# Patient Record
Sex: Male | Born: 1955 | Race: White | Hispanic: No | Marital: Married | Smoking: Former smoker
Health system: Southern US, Community
[De-identification: ages and names within clinical notes are randomized; demographics above are authoritative.]

## PROBLEM LIST (undated history)

## (undated) DIAGNOSIS — F32A Depression, unspecified: Secondary | ICD-10-CM

## (undated) DIAGNOSIS — M1990 Unspecified osteoarthritis, unspecified site: Secondary | ICD-10-CM

## (undated) DIAGNOSIS — E785 Hyperlipidemia, unspecified: Secondary | ICD-10-CM

## (undated) DIAGNOSIS — Z833 Family history of diabetes mellitus: Secondary | ICD-10-CM

## (undated) DIAGNOSIS — Z87442 Personal history of urinary calculi: Secondary | ICD-10-CM

## (undated) DIAGNOSIS — N200 Calculus of kidney: Secondary | ICD-10-CM

## (undated) DIAGNOSIS — F329 Major depressive disorder, single episode, unspecified: Secondary | ICD-10-CM

## (undated) HISTORY — PX: LITHOTRIPSY: SUR834

## (undated) HISTORY — PX: NO PAST SURGERIES: SHX2092

## (undated) HISTORY — DX: Calculus of kidney: N20.0

## (undated) HISTORY — DX: Hyperlipidemia, unspecified: E78.5

## (undated) HISTORY — DX: Family history of diabetes mellitus: Z83.3

---
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
## 2005-02-03 ENCOUNTER — Other Ambulatory Visit: Payer: Self-pay

## 2005-02-03 ENCOUNTER — Emergency Department: Payer: Self-pay | Admitting: Emergency Medicine

## 2005-02-06 ENCOUNTER — Inpatient Hospital Stay: Payer: Self-pay

## 2011-12-15 ENCOUNTER — Ambulatory Visit: Payer: Self-pay | Admitting: Family Medicine

## 2016-01-13 ENCOUNTER — Ambulatory Visit (INDEPENDENT_AMBULATORY_CARE_PROVIDER_SITE_OTHER): Payer: 59 | Admitting: Family Medicine

## 2016-01-13 ENCOUNTER — Encounter: Payer: Self-pay | Admitting: Family Medicine

## 2016-01-13 VITALS — BP 116/80 | HR 58 | Temp 98.0°F | Resp 16 | Ht 67.0 in | Wt 185.4 lb

## 2016-01-13 DIAGNOSIS — M7062 Trochanteric bursitis, left hip: Secondary | ICD-10-CM | POA: Diagnosis not present

## 2016-01-13 DIAGNOSIS — G8929 Other chronic pain: Secondary | ICD-10-CM | POA: Diagnosis not present

## 2016-01-13 DIAGNOSIS — M7061 Trochanteric bursitis, right hip: Secondary | ICD-10-CM | POA: Diagnosis not present

## 2016-01-13 DIAGNOSIS — M25512 Pain in left shoulder: Secondary | ICD-10-CM | POA: Diagnosis not present

## 2016-01-13 DIAGNOSIS — E785 Hyperlipidemia, unspecified: Secondary | ICD-10-CM | POA: Insufficient documentation

## 2016-01-13 DIAGNOSIS — Z87442 Personal history of urinary calculi: Secondary | ICD-10-CM | POA: Insufficient documentation

## 2016-01-13 DIAGNOSIS — L309 Dermatitis, unspecified: Secondary | ICD-10-CM | POA: Insufficient documentation

## 2016-01-13 MED ORDER — MELOXICAM 15 MG PO TABS: 15.0000 mg | ORAL_TABLET | Freq: Every day | ORAL | Status: DC

## 2016-01-13 NOTE — Patient Instructions (Signed)
Give the meloxicam 1-2 weeks to help. Let me know if you wish orthopedic referral for possible injection.
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
## 2016-02-04 ENCOUNTER — Encounter: Payer: Self-pay | Admitting: Physician Assistant

## 2016-02-04 ENCOUNTER — Ambulatory Visit (INDEPENDENT_AMBULATORY_CARE_PROVIDER_SITE_OTHER): Payer: 59 | Admitting: Physician Assistant

## 2016-02-04 VITALS — BP 140/80 | HR 72 | Temp 98.2°F | Resp 16 | Wt 187.6 lb

## 2016-02-04 DIAGNOSIS — H6983 Other specified disorders of Eustachian tube, bilateral: Secondary | ICD-10-CM | POA: Diagnosis not present

## 2016-02-04 DIAGNOSIS — H9193 Unspecified hearing loss, bilateral: Secondary | ICD-10-CM | POA: Diagnosis not present

## 2016-02-04 NOTE — Patient Instructions (Signed)

## 2016-02-04 NOTE — Progress Notes (Signed)
Patient: Brandon Galvan Male    DOB: 18-May-1956   60 y.o.   MRN: [id number] Visit Date: 02/04/2016  Today's Provider: Mar Daring, PA-C   Chief Complaint  Patient presents with  . Ear Fullness   Subjective:    Ear Fullness  There is pain in both ears. This is a new problem. The current episode started 1 to 4 weeks ago (For week). The problem occurs constantly. The problem has been gradually worsening. There has been no fever. The patient is experiencing no pain. 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 Pertinent negatives include no ear discharge or headaches. He has tried nothing for the symptoms.       Allergies  Allergen Reactions  . Amoxicillin Rash   Previous Medications   MELOXICAM (MOBIC) 15 MG TABLET    Take 1 tablet (15 mg total) by mouth daily.    Review of Systems  Constitutional: Positive for fatigue.  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 Negative for ear discharge and ear pain.   Respiratory: Negative.   Cardiovascular: Negative.   Gastrointestinal: Negative.   Neurological: Negative for dizziness and headaches.    Social History  Substance Use Topics  . Smoking status: Former Smoker    Types: Cigars  . Smokeless tobacco: Not on file  . Alcohol Use: Not on file   Objective:   BP 140/80 mmHg  Pulse 72  Temp(Src) 98.2 F (36.8 C) (Oral)  Resp 16  Wt 187 lb 9.6 oz (85.095 kg)  Physical Exam  Constitutional: He appears well-developed and well-nourished. No distress.  HENT:  Head: Normocephalic and atraumatic.  Right Ear: Hearing, tympanic membrane, external ear and ear canal normal. Tympanic membrane is not erythematous and not bulging.  No middle ear effusion.  Left Ear: Hearing, tympanic membrane, external ear and ear canal normal. Tympanic membrane is not erythematous and not bulging.  No middle ear effusion.  Nose: Nose normal. Right sinus exhibits no maxillary sinus tenderness and no frontal sinus tenderness. Left sinus exhibits no maxillary sinus tenderness and no frontal sinus tenderness.  Mouth/Throat: Uvula is midline, oropharynx is clear and moist and mucous membranes are normal. No oropharyngeal exudate, posterior oropharyngeal edema or posterior oropharyngeal erythema.  Eyes: Conjunctivae and EOM are normal. Pupils are equal, round, and reactive to light. Right eye exhibits no discharge. Left eye exhibits no discharge.  Neck: Normal range of motion. Neck supple. No tracheal deviation present. No Brudzinski's sign and no Kernig's sign noted. No thyromegaly present.  Cardiovascular: Normal rate, regular rhythm and normal heart sounds.  Exam reveals no gallop and no friction rub.   No murmur heard. Pulmonary/Chest: Effort normal and breath sounds normal. No stridor. No respiratory distress. He has no wheezes. He has no rales.  Lymphadenopathy:    He has no cervical adenopathy.  Skin: Skin is warm and dry. He is not diaphoretic.  Vitals reviewed.   Hearing Screening   125Hz  250Hz  500Hz  1000Hz  2000Hz  4000Hz  8000Hz   Right ear:   20 20 20 20    Left ear:   20 20 20  20  Assessment & Plan:     1. ETD (eustachian tube dysfunction), bilateral I do feel that his symptoms are most likely related to eustachian tube dysfunction being that he is having ear fullness and popping sensations as well. Hearing test today was normal in the office. I did advise him to start using Flonase 2 sprays each nostril daily to see if this helps his symptoms. He is to call the office if symptoms failed to improve or worsen.  2. Decreased hearing, bilateral Hearing screen was normal today. No cerumen buildup or impaction. - Hearing  screening; Future       Mar Daring, PA-C  [facility]

## 2016-02-09 ENCOUNTER — Other Ambulatory Visit: Payer: Self-pay | Admitting: Family Medicine

## 2016-03-16 ENCOUNTER — Other Ambulatory Visit: Payer: Self-pay | Admitting: Family Medicine

## 2016-09-08 ENCOUNTER — Other Ambulatory Visit: Payer: Self-pay | Admitting: Family Medicine

## 2016-10-15 ENCOUNTER — Other Ambulatory Visit: Payer: Self-pay | Admitting: Family Medicine

## 2016-12-08 ENCOUNTER — Encounter: Payer: Self-pay | Admitting: Family Medicine

## 2016-12-08 ENCOUNTER — Ambulatory Visit (INDEPENDENT_AMBULATORY_CARE_PROVIDER_SITE_OTHER): Payer: 59 | Admitting: Family Medicine

## 2016-12-08 VITALS — BP 132/94 | HR 64 | Temp 98.1°F | Resp 16 | Wt 181.2 lb

## 2016-12-08 DIAGNOSIS — R6882 Decreased libido: Secondary | ICD-10-CM | POA: Diagnosis not present

## 2016-12-08 DIAGNOSIS — R5383 Other fatigue: Secondary | ICD-10-CM | POA: Diagnosis not present

## 2016-12-08 DIAGNOSIS — Z13220 Encounter for screening for lipoid disorders: Secondary | ICD-10-CM

## 2016-12-08 NOTE — Patient Instructions (Signed)
Please get the labs fasting. We will call you about the results.

## 2016-12-08 NOTE — Progress Notes (Signed)
Subjective:     Patient ID: Brandon Galvan, male   DOB: 1956-04-03, 61 y.o.   MRN: [id number]  HPI  Chief Complaint  Patient presents with  . Labs Only    Patient comes in office today to discuss getting lab test to check his testosterone level. Patient reports "my strength and interest is not what it use to be".  Reports decreased libido and decreasing erections. States stamina for playing golf and work has decreased. He has obtained his black belt. 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 Denies depression or stress. Nocturia x one.   Review of Systems     Objective:   Physical Exam  Constitutional: He appears well-developed and well-nourished. No distress.  Psychiatric: He has a normal mood and affect. His behavior is normal.       Assessment:    1. Decreased libido - PSA - Testosterone  2. Decreased stamina - CBC with Differential/Platelet - Comprehensive metabolic panel - T4, free - TSH  3. Screening for cholesterol level - Lipid panel    Plan:    Further f/u pending lab work. He is aware that he will need a second AM testosterone level.

## 2016-12-16 ENCOUNTER — Telehealth: Payer: Self-pay

## 2016-12-16 LAB — CBC WITH DIFFERENTIAL/PLATELET
Basophils Absolute: 0 10*3/uL (ref 0.0–0.2)
Basos: 0 %
EOS (ABSOLUTE): 0.3 10*3/uL (ref 0.0–0.4)
EOS: 3 %
HEMATOCRIT: 45.2 % (ref 37.5–51.0)
HEMOGLOBIN: 15.7 g/dL (ref 13.0–17.7)
Immature Grans (Abs): 0 10*3/uL (ref 0.0–0.1)
Immature Granulocytes: 0 %
Lymphocytes Absolute: 1.1 10*3/uL (ref 0.7–3.1)
Lymphs: 12 %
MCH: 31.9 pg (ref 26.6–33.0)
MCHC: 34.7 g/dL (ref 31.5–35.7)
MCV: 92 fL (ref 79–97)
MONOCYTES: 13 %
Monocytes Absolute: 1.2 10*3/uL — ABNORMAL HIGH (ref 0.1–0.9)
NEUTROS ABS: 6.6 10*3/uL (ref 1.4–7.0)
Neutrophils: 72 %
Platelets: 206 10*3/uL (ref 150–379)
RBC: 4.92 x10E6/uL (ref 4.14–5.80)
RDW: 13.5 % (ref 12.3–15.4)
WBC: 9.3 10*3/uL (ref 3.4–10.8)

## 2016-12-16 LAB — COMPREHENSIVE METABOLIC PANEL
ALK PHOS: 73 IU/L (ref 39–117)
ALT: 20 IU/L (ref 0–44)
AST: 18 IU/L (ref 0–40)
Albumin/Globulin Ratio: 1.9 (ref 1.2–2.2)
Albumin: 4.3 g/dL (ref 3.6–4.8)
BILIRUBIN TOTAL: 0.6 mg/dL (ref 0.0–1.2)
BUN/Creatinine Ratio: 15 (ref 10–24)
BUN: 14 mg/dL (ref 8–27)
CHLORIDE: 98 mmol/L (ref 96–106)
CO2: 25 mmol/L (ref 18–29)
Calcium: 9.6 mg/dL (ref 8.6–10.2)
Creatinine, Ser: 0.94 mg/dL (ref 0.76–1.27)
GFR calc Af Amer: 101 mL/min/{1.73_m2} (ref >59)
GFR calc non Af Amer: 87 mL/min/{1.73_m2} (ref >59)
Globulin, Total: 2.3 g/dL (ref 1.5–4.5)
Glucose: 105 mg/dL — ABNORMAL HIGH (ref 65–99)
POTASSIUM: 4.3 mmol/L (ref 3.5–5.2)
SODIUM: 138 mmol/L (ref 134–144)
Total Protein: 6.6 g/dL (ref 6.0–8.5)

## 2016-12-16 LAB — TSH: TSH: 1.89 u[IU]/mL (ref 0.450–4.500)

## 2016-12-16 LAB — LIPID PANEL
CHOL/HDL RATIO: 4.6 ratio (ref 0.0–5.0)
Cholesterol, Total: 201 mg/dL — ABNORMAL HIGH (ref 100–199)
HDL: 44 mg/dL (ref >39)
LDL CALC: 136 mg/dL — AB (ref 0–99)
TRIGLYCERIDES: 107 mg/dL (ref 0–149)
VLDL Cholesterol Cal: 21 mg/dL (ref 5–40)

## 2016-12-16 LAB — T4, FREE: FREE T4: 1.19 ng/dL (ref 0.82–1.77)

## 2016-12-16 LAB — PSA: Prostate Specific Ag, Serum: 1.9 ng/mL (ref 0.0–4.0)

## 2016-12-16 LAB — TESTOSTERONE: Testosterone: 306 ng/dL (ref 264–916)

## 2016-12-16 NOTE — Telephone Encounter (Signed)
LMTCB 12/16/2016   Thanks,   -Mickel Baas

## 2016-12-16 NOTE — Telephone Encounter (Signed)
Pt advised.   Thanks,   -Laura  

## 2016-12-16 NOTE — Telephone Encounter (Signed)
-----   Message from Carmon Ginsberg, [place] sent at 12/16/2016  7:55 AM EDT ----- Mild cholesterol elevation but due to your age and mildly elevated blood pressure your calculated risk for developing cardiovascular disease is 10.8% over the next 10 years. We usually recommend a cholesterol lowering drug for risk > 7.5%. Would like to have you come in for a couple of nurse bp checks (won't cost you) over the next week or two. Let's see how your blood pressures does then we can discuss all of this at an office visit in a couple of weeks. Your testosterone level was normal.

## 2016-12-29 ENCOUNTER — Ambulatory Visit (INDEPENDENT_AMBULATORY_CARE_PROVIDER_SITE_OTHER): Payer: 59 | Admitting: Family Medicine

## 2016-12-29 ENCOUNTER — Encounter: Payer: Self-pay | Admitting: Family Medicine

## 2016-12-29 VITALS — BP 144/102 | HR 68 | Temp 97.9°F | Resp 16 | Wt 179.2 lb

## 2016-12-29 DIAGNOSIS — E782 Mixed hyperlipidemia: Secondary | ICD-10-CM

## 2016-12-29 DIAGNOSIS — R030 Elevated blood-pressure reading, without diagnosis of hypertension: Secondary | ICD-10-CM | POA: Diagnosis not present

## 2016-12-29 NOTE — Progress Notes (Signed)
Subjective:     Patient ID: Brandon Galvan, male   DOB: 1956/07/23, 61 y.o.   MRN: [id number]  HPI  Chief Complaint  Patient presents with  . Hypertension    Patient comes in office today to follow up for hypertension, patients blood pressure at last office visit was 132/94. Patient states that he has checked his blood pressure reading at home and it has been ranging around 110/54, patient states he has been under stress with work but is unsure if that is playinbg a factor in elevated blood pressure.   . Abnormal Lab    Patient would like to address Lipid panel drawn on 12/08/16. Patients lipid panel showed mild cholesterol elevation his 39yr risk for developing cardiovascular disease was at 10.8%. Patient states that he would like to discuss today lifestyle changes to reduce risk.   States he has started eating healthier stating he has quit [place]. Cholesterol was mildly elevated with age and mildly elevated blood pressure being the primary metrics elevating his risk. Continues to stay active with karate, golf, and an active job at [employer].   Review of Systems     Objective:   Physical Exam  Constitutional: He appears well-developed and well-nourished. No distress.       Assessment:    1. Mixed hyperlipidemia  2. Blood pressure elevated without history of HTN    Plan:    Provided with handouts regarding cholesterol and low cholesterol dietary choices. Discussed coming in with his home bp meter for comparison nurse bp checks.

## 2016-12-29 NOTE — Patient Instructions (Signed)
Come in for a nurse bp check to compare your machine with our. Continue wise food choices to lower cholesterol and continue regular exercise. Cholesterol Cholesterol is a fat. Your body needs a small amount of cholesterol. Cholesterol (plaque) may build up in your blood vessels (arteries). That makes you more likely to have a heart attack or stroke. You cannot feel your cholesterol level. Having a blood test is the only way to find out if your level is high. Keep your test results. Work with your doctor to keep your cholesterol at a good level. What do the results mean?  Total cholesterol is how much cholesterol is in your blood.  LDL is bad cholesterol. This is the type that can build up. Try to have low LDL.  HDL is good cholesterol. It cleans your blood vessels and carries LDL away. Try to have high HDL.  Triglycerides are fat that the body can store or burn for energy. What are good levels of cholesterol?  Total cholesterol below 200.  LDL below 100 is good for people who have health risks. LDL below 70 is good for people who have very high risks.  HDL above 40 is good. It is best to have HDL of 60 or higher.  Triglycerides below 150. How can I lower my cholesterol? Diet  Follow your diet program as told by your doctor.  Choose fish, white meat chicken, or [place] that is roasted or baked. Try not to eat red meat, fried foods, sausage, or lunch meats.  Eat lots of fresh fruits and vegetables.  Choose whole grains, beans, pasta, potatoes, and cereals.  Choose olive oil, corn oil, or canola oil. Only use small amounts.  Try not to eat butter, mayonnaise, shortening, or palm kernel oils.  Try not to eat foods with trans fats.  Choose low-fat or nonfat dairy foods.  Drink skim or nonfat milk.  Eat low-fat or nonfat yogurt and cheeses.  Try not to drink whole milk or cream.  Try not to eat ice cream, egg yolks, or full-fat cheeses.  Healthy desserts include angel food  cake, ginger snaps, animal crackers, hard candy, popsicles, and low-fat or nonfat frozen yogurt. Try not to eat pastries, cakes, pies, and cookies. Exercise  Follow your exercise program as told by your doctor.  Be more active. Try gardening, walking, and taking the stairs.  Ask your doctor about ways that you can be more active. Medicine  Take over-the-counter and prescription medicines only as told by your doctor. This information is not intended to replace advice given to you by your health care provider. Make sure you discuss any questions you have with your health care provider. Document Released: 12/02/2008 Document Revised: 04/06/2016 Document Reviewed: 03/17/2016 Elsevier Interactive Patient Education  2017 [employer].

## 2017-05-11 ENCOUNTER — Encounter: Payer: Self-pay | Admitting: Family Medicine

## 2017-05-11 ENCOUNTER — Ambulatory Visit (INDEPENDENT_AMBULATORY_CARE_PROVIDER_SITE_OTHER): Payer: 59 | Admitting: Family Medicine

## 2017-05-11 VITALS — BP 138/82 | HR 62 | Temp 97.8°F | Resp 16 | Ht 66.5 in | Wt 187.0 lb

## 2017-05-11 DIAGNOSIS — E782 Mixed hyperlipidemia: Secondary | ICD-10-CM

## 2017-05-11 DIAGNOSIS — N529 Male erectile dysfunction, unspecified: Secondary | ICD-10-CM | POA: Diagnosis not present

## 2017-05-11 DIAGNOSIS — Z1211 Encounter for screening for malignant neoplasm of colon: Secondary | ICD-10-CM

## 2017-05-11 DIAGNOSIS — Z23 Encounter for immunization: Secondary | ICD-10-CM | POA: Diagnosis not present

## 2017-05-11 MED ORDER — MELOXICAM 15 MG PO TABS: 15.0000 mg | ORAL_TABLET | ORAL | 0 refills | Status: DC | PRN

## 2017-05-11 MED ORDER — SILDENAFIL CITRATE 50 MG PO TABS: 50.0000 mg | ORAL_TABLET | Freq: Every day | ORAL | 5 refills | Status: DC | PRN

## 2017-05-11 NOTE — Progress Notes (Addendum)
Subjective:     Patient ID: Brandon Galvan, male   DOB: 1955/09/29, 61 y.o.   MRN: [id number]  HPI  Chief Complaint  Patient presents with  . Hyperlipidemia    Patient comes in today to follow up on his labs. He feels well today with no other complaints.   He has recently had labs drawn but forgot to bring them today. He has been working on food choices and remains active. He is willing to get a screening colonoscopy and update his tetanus shot today. Reports mild ED and wishes to try medication (Normal PSA in March)  Review of Systems  Respiratory: Negative for shortness of breath.   Cardiovascular: Negative for chest pain and palpitations.       Objective:   Physical Exam  Constitutional: He appears well-developed and well-nourished. No distress.       Assessment:    1. Mixed hyperlipidemia: pending review of lab results  2. Erectile dysfunction, unspecified erectile dysfunction type - sildenafil (VIAGRA) 50 MG tablet; Take 1 tablet (50 mg total) by mouth daily as needed for erectile dysfunction.  Dispense: 6 tablet; Refill: 5  3. Screening for colon cancer - Ambulatory referral to Gastroenterology  4. Need for tetanus, diphtheria, and acellular pertussis (Tdap) vaccine - Tdap vaccine greater than or equal to 7yo IM    Plan:    Further f/u pending review of lab results.

## 2017-05-11 NOTE — Patient Instructions (Addendum)
I will call you after reviewing your lab results. We will also call you about the referral. Let me know how Viagra works for you.

## 2017-05-19 ENCOUNTER — Telehealth: Payer: Self-pay

## 2017-05-19 ENCOUNTER — Other Ambulatory Visit: Payer: Self-pay

## 2017-05-19 DIAGNOSIS — Z1211 Encounter for screening for malignant neoplasm of colon: Secondary | ICD-10-CM

## 2017-05-19 MED ORDER — NA SULFATE-K SULFATE-MG SULF 17.5-3.13-1.6 GM/177ML PO SOLN: 1.0000 | Freq: Once | ORAL | 0 refills | Status: AC

## 2017-05-19 NOTE — Telephone Encounter (Signed)
Gastroenterology Pre-Procedure Review  Request Date: 9/27 Requesting Physician: Dr. Vicente Males  *No major illnesses at this time.  PATIENT REVIEW QUESTIONS: The patient responded to the following health history questions as indicated:    1. Are you having any GI issues? no 2. Do you have a personal history of Polyps? no 3. Do you have a family history of Colon Cancer or Polyps? Yes: father (polyps) 4. Diabetes Mellitus? No 5. Joint replacements in the past 12 months?no 6. Major health problems in the past 3 months?no 7. Any artificial heart valves, MVP, or defibrillator?no    MEDICATIONS & ALLERGIES:    Patient reports the following regarding taking any anticoagulation/antiplatelet therapy:   Plavix, Coumadin, Eliquis, Xarelto, Lovenox, Pradaxa, Brilinta, or Effient? no Aspirin? no  Patient confirms/reports the following medications:  Current Outpatient Prescriptions  Medication Sig Dispense Refill  . meloxicam (MOBIC) 15 MG tablet Take 1 tablet (15 mg total) by mouth as needed for pain. 30 tablet 0  . sildenafil (VIAGRA) 50 MG tablet Take 1 tablet (50 mg total) by mouth daily as needed for erectile dysfunction. 6 tablet 5   No current facility-administered medications for this visit.     Patient confirms/reports the following allergies:  Allergies  Allergen Reactions  . Amoxicillin Rash    No orders of the defined types were placed in this encounter.   AUTHORIZATION INFORMATION Primary Insurance: 1D#: Group #:  Secondary Insurance: 1D#: Group #:  SCHEDULE INFORMATION: Date: 9/27 Time: Location: [facility]

## 2017-05-19 NOTE — Telephone Encounter (Signed)
Patient requested that communication be sent via email, Patient not setup on mychart.  Sent paper mail instead.

## 2017-06-07 ENCOUNTER — Other Ambulatory Visit: Payer: Self-pay | Admitting: Family Medicine

## 2017-06-15 ENCOUNTER — Encounter
Admission: RE | Disposition: A | Discharge status: Home / Self Care | Payer: Self-pay | Source: Ambulatory Visit | Attending: Gastroenterology

## 2017-06-15 ENCOUNTER — Ambulatory Visit: Payer: 59 | Admitting: Anesthesiology

## 2017-06-15 ENCOUNTER — Ambulatory Visit
Admission: RE | Admit: 2017-06-15 | Discharge: 2017-06-15 | Disposition: A | Discharge status: Home / Self Care | Payer: 59 | Source: Ambulatory Visit | Attending: Gastroenterology | Admitting: Gastroenterology

## 2017-06-15 DIAGNOSIS — K5730 Diverticulosis of large intestine without perforation or abscess without bleeding: Secondary | ICD-10-CM | POA: Diagnosis not present

## 2017-06-15 DIAGNOSIS — Z8371 Family history of colonic polyps: Secondary | ICD-10-CM | POA: Diagnosis not present

## 2017-06-15 DIAGNOSIS — K640 First degree hemorrhoids: Secondary | ICD-10-CM | POA: Diagnosis not present

## 2017-06-15 DIAGNOSIS — D122 Benign neoplasm of ascending colon: Secondary | ICD-10-CM

## 2017-06-15 DIAGNOSIS — Z87891 Personal history of nicotine dependence: Secondary | ICD-10-CM | POA: Insufficient documentation

## 2017-06-15 DIAGNOSIS — Z1211 Encounter for screening for malignant neoplasm of colon: Secondary | ICD-10-CM | POA: Diagnosis present

## 2017-06-15 DIAGNOSIS — D120 Benign neoplasm of cecum: Secondary | ICD-10-CM | POA: Diagnosis not present

## 2017-06-15 DIAGNOSIS — K635 Polyp of colon: Secondary | ICD-10-CM | POA: Insufficient documentation

## 2017-06-15 DIAGNOSIS — D123 Benign neoplasm of transverse colon: Secondary | ICD-10-CM

## 2017-06-15 HISTORY — PX: COLONOSCOPY WITH PROPOFOL: SHX5780

## 2017-06-15 SURGERY — COLONOSCOPY WITH PROPOFOL
Anesthesia: General

## 2017-06-15 MED ORDER — PROPOFOL 10 MG/ML IV BOLUS
INTRAVENOUS | Status: DC | PRN
  Administered 2017-06-15: 100 mg via INTRAVENOUS

## 2017-06-15 MED ORDER — FENTANYL CITRATE (PF) 100 MCG/2ML IJ SOLN
INTRAMUSCULAR | Status: DC | PRN
  Administered 2017-06-15 (×2): 50 ug via INTRAVENOUS

## 2017-06-15 MED ORDER — LIDOCAINE HCL (PF) 2 % IJ SOLN
INTRAMUSCULAR | Status: AC
Start: 2017-06-15 — End: 2017-06-15
  Filled 2017-06-15: qty 2

## 2017-06-15 MED ORDER — FENTANYL CITRATE (PF) 100 MCG/2ML IJ SOLN
INTRAMUSCULAR | Status: AC
  Filled 2017-06-15: qty 2

## 2017-06-15 MED ORDER — EPHEDRINE SULFATE 50 MG/ML IJ SOLN
INTRAMUSCULAR | Status: DC | PRN
  Administered 2017-06-15: 10 mg via INTRAVENOUS

## 2017-06-15 MED ORDER — PHENYLEPHRINE HCL 10 MG/ML IJ SOLN
INTRAMUSCULAR | Status: DC | PRN
Start: 2017-06-15 — End: 2017-06-15
  Administered 2017-06-15: 100 ug via INTRAVENOUS

## 2017-06-15 MED ORDER — LIDOCAINE 2% (20 MG/ML) 5 ML SYRINGE
INTRAMUSCULAR | Status: DC | PRN
  Administered 2017-06-15: 40 mg via INTRAVENOUS

## 2017-06-15 MED ORDER — PROPOFOL 500 MG/50ML IV EMUL
INTRAVENOUS | Status: DC | PRN
  Administered 2017-06-15: 180 ug/kg/min via INTRAVENOUS

## 2017-06-15 MED ORDER — SODIUM CHLORIDE 0.9 % IV SOLN
INTRAVENOUS | Status: DC
  Administered 2017-06-15 (×2): via INTRAVENOUS

## 2017-06-15 MED ORDER — PROPOFOL 500 MG/50ML IV EMUL
INTRAVENOUS | Status: AC
  Filled 2017-06-15: qty 50

## 2017-06-15 NOTE — H&P (Signed)
  Jonathon Bellows MD [address] [place], [place] [address] Phone: [phone] Fax : [phone]  Primary Care Physician:  Carmon Ginsberg, [place] Primary Gastroenterologist:  Dr. Jonathon Bellows   Pre-Procedure History & Physical: HPI:  Brandon Galvan is a 61 y.o. male is here for an colonoscopy.   Past Medical History:  Diagnosis Date  . Family history of diabetes mellitus   . Hyperlipidemia     Past Surgical History:  Procedure Laterality Date  . NO PAST SURGERIES      Prior to Admission medications   Medication Sig Start Date End Date Taking? Authorizing Provider  meloxicam (MOBIC) 15 MG tablet TAKE 1 TABLET BY MOUTH DAILY AS NEEDED FOR PAIN 06/07/17  Yes Carmon Ginsberg, PA  sildenafil (VIAGRA) 50 MG tablet Take 1 tablet (50 mg total) by mouth daily as needed for erectile dysfunction. 05/11/17   Carmon Ginsberg, PA    Allergies as of 05/19/2017 - Review Complete 05/11/2017  Allergen Reaction Noted  . Amoxicillin Rash 01/13/2016    Family History  Problem Relation Age of Onset  . Osteoporosis Mother   . Arthritis Mother   . Dementia Mother   . Hypertension Father   . Diabetes Father   . Diabetes Sister   . Hypertension Sister   . Healthy Daughter   . Healthy Son   . Healthy Daughter   . Healthy Daughter     Social History   Social History  . Marital status: Married    Spouse name: N/A  . Number of children: N/A  . Years of education: N/A   Occupational History  . Not on file.   Social History Main Topics  . Smoking status: Former Smoker    Types: Cigars  . Smokeless tobacco: Never Used  . Alcohol use Not on file  . Drug use: Unknown  . Sexual activity: Not on file   Other Topics Concern  . Not on file   Social History Narrative  . No narrative on file    Review of Systems: See HPI, otherwise negative ROS  Physical Exam: BP (!) 147/83   Pulse 75   Temp 98.9 F (37.2 C) (Tympanic)   Resp 18   Ht 5\' 7"  (1.702 m)   Wt 178 lb (80.7  kg)   SpO2 99%   BMI 27.88 kg/m  General:   Alert,  pleasant and cooperative in NAD Head:  Normocephalic and atraumatic. Neck:  Supple; no masses or thyromegaly. Lungs:  Clear throughout to auscultation.    Heart:  Regular rate and rhythm. Abdomen:  Soft, nontender and nondistended. Normal bowel sounds, without guarding, and without rebound.   Neurologic:  Alert and  oriented x4;  grossly normal neurologically.  Impression/Plan: Brandon Galvan is here for an colonoscopy to be performed for screening colonoscopy ( father had colon polyps)  Risks, benefits, limitations, and alternatives regarding  colonoscopy have been reviewed with the patient.  Questions have been answered.  All parties agreeable.   Jonathon Bellows, MD  06/15/2017, 10:16 AM

## 2017-06-15 NOTE — Anesthesia Post-op Follow-up Note (Signed)
Anesthesia QCDR form completed.        

## 2017-06-15 NOTE — Op Note (Signed)
[facility] Patient Name: Brandon Galvan Procedure Date: 06/15/2017 10:55 AM MRN: [id number] Account #: [id number] Date of Birth: 04-28-1956 Admit Type: Outpatient Age: 61 Room: [facility] ENDO ROOM 4 Gender: Male Note Status: Finalized Procedure:            Colonoscopy Indications:          Screening for colorectal malignant neoplasm Providers:            Jonathon Bellows MD, MD Referring MD:         Rose Fillers. Jaynie Crumble, MD (Referring MD) Medicines:            Monitored Anesthesia Care Complications:        No immediate complications. Procedure:            Pre-Anesthesia Assessment:                       - Prior to the procedure, a History and Physical was                        performed, and patient medications, allergies and                        sensitivities were reviewed. The patient's tolerance of                        previous anesthesia was reviewed.                       - The risks and benefits of the procedure and the                        sedation options and risks were discussed with the                        patient. All questions were answered and informed                        consent was obtained.                       - ASA Grade Assessment: II - A patient with mild                        systemic disease.                       After obtaining informed consent, the colonoscope was                        passed under direct vision. Throughout the procedure,                        the patient's blood pressure, pulse, and oxygen                        saturations were monitored continuously. The                        Colonoscope was introduced through the anus and  advanced to the the cecum, identified by the                        appendiceal orifice, IC valve and transillumination.                        The colonoscopy was performed with ease. The patient                        tolerated the procedure well. The  quality of the bowel                        preparation was good. Findings:      The perianal and digital rectal examinations were normal.      Multiple medium-mouthed diverticula were found in the sigmoid colon.      Non-bleeding internal hemorrhoids were found during retroflexion. The       hemorrhoids were small and Grade I (internal hemorrhoids that do not       prolapse).      Two sessile polyps were found in the cecum. The polyps were 2 to 4 mm in       size. These polyps were removed with a cold biopsy forceps. Resection       and retrieval were complete.      Two sessile polyps were found in the transverse colon and ascending       colon. The polyps were 3 to 4 mm in size. These polyps were removed with       a cold biopsy forceps. Resection and retrieval were complete.      A 6 mm polyp was found in the transverse colon. The polyp was sessile.       The polyp was removed with a cold snare. Resection and retrieval were       complete.      The exam was otherwise without abnormality on direct and retroflexion       views. Impression:           - Diverticulosis in the sigmoid colon.                       - Non-bleeding internal hemorrhoids.                       - Two 2 to 4 mm polyps in the cecum, removed with a                        cold biopsy forceps. Resected and retrieved.                       - Two 3 to 4 mm polyps in the transverse colon and in                        the ascending colon, removed with a cold biopsy                        forceps. Resected and retrieved.                       - One 6 mm polyp in the transverse colon, removed with  a cold snare. Resected and retrieved.                       - The examination was otherwise normal on direct and                        retroflexion views. Recommendation:       - Discharge patient to home (with escort).                       - Resume previous diet.                       - Continue present  medications.                       - Await pathology results.                       - Repeat colonoscopy for surveillance based on                        pathology results. Procedure Code(s):    --- Professional ---                       [id number], Colonoscopy, flexible; with removal of tumor(s),                        polyp(s), or other lesion(s) by snare technique                       45380, 37, Colonoscopy, flexible; with biopsy, single                        or multiple Diagnosis Code(s):    --- Professional ---                       Z12.11, Encounter for screening for malignant neoplasm                        of colon                       K64.0, First degree hemorrhoids                       D12.0, Benign neoplasm of cecum                       D12.3, Benign neoplasm of transverse colon (hepatic                        flexure or splenic flexure)                       D12.2, Benign neoplasm of ascending colon                       K57.30, Diverticulosis of large intestine without                        perforation or abscess without bleeding CPT copyright 2016 American Medical Association. All rights reserved. The codes documented in this report  are preliminary and upon coder review may  be revised to meet current compliance requirements. Jonathon Bellows, MD Jonathon Bellows MD, MD 06/15/2017 11:20:50 AM This report has been signed electronically. Number of Addenda: 0 Note Initiated On: 06/15/2017 10:55 AM Scope Withdrawal Time: 0 hours 16 minutes 1 second  Total Procedure Duration: 0 hours 18 minutes 23 seconds       [facility]

## 2017-06-15 NOTE — Transfer of Care (Signed)
Immediate Anesthesia Transfer of Care Note  Patient: Brandon Galvan  Procedure(s) Performed: Procedure(s): COLONOSCOPY WITH PROPOFOL (N/A)  Patient Location: PACU and Endoscopy Unit  Anesthesia Type:General  Level of Consciousness: drowsy  Airway & Oxygen Therapy: Patient Spontanous Breathing and Patient connected to nasal cannula oxygen  Post-op Assessment: Report given to RN and Post -op Vital signs reviewed and stable  Post vital signs: Reviewed and stable  Last Vitals:  Vitals:   06/15/17 0922  BP: (!) 147/83  Pulse: 75  Resp: 18  Temp: 37.2 C  SpO2: 99%    Last Pain:  Vitals:   06/15/17 0922  TempSrc: Tympanic         Complications: No apparent anesthesia complications

## 2017-06-15 NOTE — Anesthesia Preprocedure Evaluation (Signed)
Anesthesia Evaluation  Patient identified by MRN, date of birth, ID band Patient awake    Reviewed: Allergy & Precautions, H&P , NPO status , Patient's Chart, lab work & pertinent test results, reviewed documented beta blocker date and time   Airway Mallampati: II   Neck ROM: full    Dental  (+) Teeth Intact   Pulmonary neg pulmonary ROS, former smoker,    Pulmonary exam normal        Cardiovascular Exercise Tolerance: Good negative cardio ROS Normal cardiovascular exam Rhythm:regular Rate:Normal     Neuro/Psych negative neurological ROS  negative psych ROS   GI/Hepatic negative GI ROS, Neg liver ROS,   Endo/Other  negative endocrine ROS  Renal/GU negative Renal ROS  negative genitourinary   Musculoskeletal   Abdominal   Peds  Hematology negative hematology ROS (+)   Anesthesia Other Findings Past Medical History: No date: Family history of diabetes mellitus No date: Hyperlipidemia Past Surgical History: No date: NO PAST SURGERIES BMI    Body Mass Index:  27.88 kg/m     Reproductive/Obstetrics negative OB ROS                             Anesthesia Physical Anesthesia Plan  ASA: II  Anesthesia Plan: General   Post-op Pain Management:    Induction:   PONV Risk Score and Plan:   Airway Management Planned:   Additional Equipment:   Intra-op Plan:   Post-operative Plan:   Informed Consent: I have reviewed the patients History and Physical, chart, labs and discussed the procedure including the risks, benefits and alternatives for the proposed anesthesia with the patient or authorized representative who has indicated his/her understanding and acceptance.   Dental Advisory Given  Plan Discussed with: CRNA  Anesthesia Plan Comments:         Anesthesia Quick Evaluation

## 2017-06-16 ENCOUNTER — Encounter: Payer: Self-pay | Admitting: Gastroenterology

## 2017-06-16 LAB — SURGICAL PATHOLOGY

## 2017-06-18 ENCOUNTER — Encounter: Payer: Self-pay | Admitting: Gastroenterology

## 2017-06-19 NOTE — Anesthesia Postprocedure Evaluation (Signed)
Anesthesia Post Note  Patient: Brandon Galvan  Procedure(s) Performed: COLONOSCOPY WITH PROPOFOL (N/A )  Patient location during evaluation: PACU Anesthesia Type: General Level of consciousness: awake and alert Pain management: pain level controlled Vital Signs Assessment: post-procedure vital signs reviewed and stable Respiratory status: spontaneous breathing, nonlabored ventilation, respiratory function stable and patient connected to nasal cannula oxygen Cardiovascular status: blood pressure returned to baseline and stable Postop Assessment: no apparent nausea or vomiting Anesthetic complications: no     Last Vitals:  Vitals:   06/15/17 1143 06/15/17 1203  BP: 107/67 111/72  Pulse: 72   Resp: 13   Temp:    SpO2: 98%     Last Pain:  Vitals:   06/15/17 1123  TempSrc: Tympanic                 Molli Barrows

## 2017-07-14 ENCOUNTER — Telehealth: Payer: Self-pay | Admitting: Family Medicine

## 2017-07-14 NOTE — Telephone Encounter (Signed)
Pt scheduled/MW

## 2017-07-14 NOTE — Telephone Encounter (Signed)
Sure if we have a supply and he has not had another shot esp.the flu shot in the last 4 weeks.

## 2017-07-14 NOTE — Telephone Encounter (Signed)
lmtcb-Brandon Galvan Brandon Galvan Brandon Galvan, RMA  

## 2017-07-14 NOTE — Telephone Encounter (Signed)
Please review-Brandon Galvan, RMA  

## 2017-07-14 NOTE — Telephone Encounter (Signed)
Pt is requesting the shingles shot.   Please let me know if pt can be scheduled.   CB#[phone]/MW

## 2017-07-17 ENCOUNTER — Ambulatory Visit: Payer: 59 | Admitting: Family Medicine

## 2017-07-19 ENCOUNTER — Ambulatory Visit: Payer: 59 | Admitting: Family Medicine

## 2017-08-25 ENCOUNTER — Encounter: Payer: Self-pay | Admitting: Family Medicine

## 2017-08-25 ENCOUNTER — Ambulatory Visit: Payer: 59 | Admitting: Family Medicine

## 2017-08-25 VITALS — BP 110/88 | HR 61 | Temp 97.8°F | Resp 16 | Wt 192.0 lb

## 2017-08-25 DIAGNOSIS — H00012 Hordeolum externum right lower eyelid: Secondary | ICD-10-CM

## 2017-08-25 MED ORDER — ERYTHROMYCIN 5 MG/GM OP OINT: TOPICAL_OINTMENT | OPHTHALMIC | 0 refills | Status: DC

## 2017-08-25 NOTE — Progress Notes (Signed)
Subjective:     Patient ID: NYSIR FERGUSSON, male   DOB: Feb 04, 1956, 61 y.o.   MRN: [id number] Chief Complaint  Patient presents with  . Stye    Patient comes in office today with conerns of a stye on his lower right lid that has been present for the past 4-5 days, patient states that eye is tender to the touch.    HPI States he has used warm compresses on a couple of occasions.  Review of Systems     Objective:   Physical Exam  Constitutional: He appears well-developed and well-nourished. No distress.  Eyes:  Right lower eyelid with sty with both internal and external swelling. External aspect is pointing. Cleansed with alcohol and area of pointing nicked with bayonet point blade with return of pus.       Assessment:    1. Hordeolum externum of right lower eyelid: erythromycin ophthalmic ointment 3 x day to right lower eyelid sac.    Plan:    Continue warm compresses.

## 2017-08-25 NOTE — Patient Instructions (Signed)
Discussed use of warm compresses several x day to right lower eyelid.

## 2017-12-22 ENCOUNTER — Other Ambulatory Visit: Payer: Self-pay | Admitting: Family Medicine

## 2018-01-11 ENCOUNTER — Encounter: Payer: Self-pay | Admitting: Family Medicine

## 2018-01-11 ENCOUNTER — Ambulatory Visit (INDEPENDENT_AMBULATORY_CARE_PROVIDER_SITE_OTHER): Payer: Managed Care, Other (non HMO) | Admitting: Family Medicine

## 2018-01-11 VITALS — BP 130/88 | HR 72 | Temp 98.2°F | Resp 16

## 2018-01-11 DIAGNOSIS — H43391 Other vitreous opacities, right eye: Secondary | ICD-10-CM

## 2018-01-11 DIAGNOSIS — M545 Low back pain, unspecified: Secondary | ICD-10-CM

## 2018-01-11 DIAGNOSIS — H8113 Benign paroxysmal vertigo, bilateral: Secondary | ICD-10-CM | POA: Diagnosis not present

## 2018-01-11 LAB — POCT URINALYSIS DIPSTICK
Appearance: NORMAL
Bilirubin, UA: NEGATIVE
Blood, UA: NEGATIVE
Glucose, UA: NEGATIVE
KETONES UA: NEGATIVE
Leukocytes, UA: NEGATIVE
NITRITE UA: NEGATIVE
ODOR: NORMAL
PH UA: 6 (ref 5.0–8.0)
SPEC GRAV UA: 1.02 (ref 1.010–1.025)
UROBILINOGEN UA: 0.2 U/dL

## 2018-01-11 MED ORDER — MECLIZINE HCL 25 MG PO TABS: 25.0000 mg | ORAL_TABLET | Freq: Three times a day (TID) | ORAL | 1 refills | Status: DC | PRN

## 2018-01-11 NOTE — Patient Instructions (Signed)

## 2018-01-11 NOTE — Progress Notes (Signed)
Patient: Brandon Galvan Male    DOB: March 04, 1956   62 y.o.   MRN: [id number] Visit Date: 01/11/2018  Today's Provider: Lavon Paganini, MD   I, Martha Clan, CMA, am acting as scribe for Lavon Paganini, MD.  Chief Complaint  Patient presents with  . Dizziness   Subjective:    Dizziness  This is a new problem. The current episode started today. The problem occurs constantly. The problem has been unchanged. Associated symptoms include myalgias (low back pain) and a visual change (yellow field in vision). Pertinent negatives include no abdominal pain, anorexia, arthralgias, change in bowel habit, chest pain, chills, congestion, coughing, diaphoresis, fatigue, fever, headaches, nausea, neck pain, numbness, sore throat, swollen glands, urinary symptoms (states he has low back pain), vomiting or weakness. Exacerbated by: positional.       Allergies  Allergen Reactions  . Amoxicillin Rash     Current Outpatient Medications:  .  meloxicam (MOBIC) 15 MG tablet, TAKE 1 TABLET BY MOUTH DAILY AS NEEDED FOR PAIN, Disp: 30 tablet, Rfl: 0 .  sildenafil (VIAGRA) 50 MG tablet, Take 1 tablet (50 mg total) by mouth daily as needed for erectile dysfunction., Disp: 6 tablet, Rfl: 5  Review of Systems  Constitutional: Negative for chills, diaphoresis, fatigue and fever.  HENT: Negative for congestion and sore throat.   Respiratory: Negative for cough.   Cardiovascular: Negative for chest pain.  Gastrointestinal: Negative for abdominal pain, anorexia, change in bowel habit, nausea and vomiting.  Musculoskeletal: Positive for myalgias (low back pain). Negative for arthralgias and neck pain.  Neurological: Positive for dizziness. Negative for weakness, numbness and headaches.    Social History   Tobacco Use  . Smoking status: Former Smoker    Types: Cigars  . Smokeless tobacco: Never Used  Substance Use Topics  . Alcohol use: Not on file   Objective:   BP 130/88 (BP  Location: Left Arm, Patient Position: Sitting, Cuff Size: Large)   Pulse 72   Temp 98.2 F (36.8 C) (Oral)   Resp 16   SpO2 98%  Vitals:   01/11/18 1324  BP: 130/88  Pulse: 72  Resp: 16  Temp: 98.2 F (36.8 C)  TempSrc: Oral  SpO2: 98%     Physical Exam  Constitutional: He is oriented to person, place, and time. He appears well-developed and well-nourished. No distress.  HENT:  Head: Normocephalic and atraumatic.  Right Ear: External ear normal.  Left Ear: External ear normal.  Nose: Nose normal. Right sinus exhibits no maxillary sinus tenderness and no frontal sinus tenderness. Left sinus exhibits no maxillary sinus tenderness and no frontal sinus tenderness.  Mouth/Throat: Uvula is midline, oropharynx is clear and moist and mucous membranes are normal.  + Epleys  Eyes: Pupils are equal, round, and reactive to light. Conjunctivae and EOM are normal. Right eye exhibits no discharge. Left eye exhibits no discharge. No scleral icterus.  Neck: Neck supple. No thyromegaly present.  Cardiovascular: Normal rate, regular rhythm, normal heart sounds and intact distal pulses.  No murmur heard. Pulmonary/Chest: Effort normal and breath sounds normal. No respiratory distress. He has no wheezes. He has no rales.  Musculoskeletal: He exhibits no edema.  Back: No midline TTP or paraspinal TTP.  LE strength and sensation intact.  Lymphadenopathy:    He has no cervical adenopathy.  Neurological: He is alert and oriented to person, place, and time.  Skin: Skin is warm and dry. Capillary refill takes less than 2 seconds.  No rash noted.  Psychiatric: He has a normal mood and affect. His behavior is normal.  Vitals reviewed.      Assessment & Plan:   1. Benign paroxysmal positional vertigo due to bilateral vestibular disorder - HPI and + Epleys consistent with BPPV - no signs of stroke and neuro intact - will treat with meclizine TID prn - symptomatic management, natural course, and  return precautions - could consider vestibular PT if continues  2. Acute midline low back pain without sciatica - likely MSK/muscle strain  - reassured patient that there is no evidence of nephrolithiasis - POCT urinalysis dipstick  3. Vitreous floaters of right eye - advised annual eye exam and to mention to his Ophthalmologist  - this is common in his age group and unlikely to be related to his BPPV.   Meds ordered this encounter  Medications  . meclizine (ANTIVERT) 25 MG tablet    Sig: Take 1 tablet (25 mg total) by mouth 3 (three) times daily as needed for dizziness.    Dispense:  90 tablet    Refill:  1     Return if symptoms worsen or fail to improve.   The entirety of the information documented in the History of Present Illness, Review of Systems and Physical Exam were personally obtained by me. Portions of this information were initially documented by Raquel Sarna Ratchford, CMA and reviewed by me for thoroughness and accuracy.    Virginia Crews, MD, MPH [facility] 01/11/2018 3:18 PM

## 2018-02-06 ENCOUNTER — Other Ambulatory Visit: Payer: Self-pay | Admitting: Family Medicine

## 2018-03-25 ENCOUNTER — Other Ambulatory Visit: Payer: Self-pay | Admitting: Family Medicine

## 2018-07-09 ENCOUNTER — Encounter: Payer: Self-pay | Admitting: Family Medicine

## 2018-07-09 ENCOUNTER — Ambulatory Visit: Payer: Managed Care, Other (non HMO) | Admitting: Family Medicine

## 2018-07-09 VITALS — BP 130/84 | HR 76 | Temp 97.6°F | Resp 16 | Wt 170.8 lb

## 2018-07-09 DIAGNOSIS — J3489 Other specified disorders of nose and nasal sinuses: Secondary | ICD-10-CM

## 2018-07-09 NOTE — Patient Instructions (Signed)
Use saline irrigation 1-2 x day then apply triple antibiotic ointment with a Q-tip afterwards.

## 2018-07-09 NOTE — Progress Notes (Signed)
  Subjective:     Patient ID: Brandon Galvan, male   DOB: 1955/11/27, 62 y.o.   MRN: [id number] Chief Complaint  Patient presents with  . Skin Problem    Patient comes in office today with concerns or a possible sore/bump on the right side of his nose for over 7 days. Patient states in the past week it has become more sore.    HPI States it is sore inside his nose Denies injury, nosebleeds, or use of nasal sprays.No MRSA exposure reported. States he has sneezed a few times.   Review of Systems     Objective:   Physical Exam  Constitutional: He appears well-developed and well-nourished. No distress.  HENT:  Area of irritation/erosion noted lateral third of right nares. No polyp or bleeding noted.       Assessment:    1. Nose irritation: Try saline rinse and abx ointment administered with a Q-tip.    Plan:    Samples of saline rinse and abx ointment provided.

## 2018-07-17 ENCOUNTER — Encounter: Payer: Self-pay | Admitting: Family Medicine

## 2018-07-17 ENCOUNTER — Ambulatory Visit: Payer: Managed Care, Other (non HMO) | Admitting: Family Medicine

## 2018-07-17 VITALS — BP 138/80 | HR 63 | Temp 97.8°F | Wt 171.8 lb

## 2018-07-17 DIAGNOSIS — R21 Rash and other nonspecific skin eruption: Secondary | ICD-10-CM

## 2018-07-17 NOTE — Progress Notes (Signed)
  Subjective:     Patient ID: Brandon Galvan, male   DOB: 17-Nov-1955, 62 y.o.   MRN: [id number] Chief Complaint  Patient presents with  . Rash    Patient presents today with a rash on his chest, arm, side, legs, and hands. Patient states that the rash has been there since over a week. Patient states the rash is red and flakey and he has not applied nothing to the rashes.   HPI States rash is asymptomatic but started on his legs months ago. Now on his upper chest and forearms. Uses an organic soap in the bath. Has an indoor cat at home. Works at [employer].  Review of Systems     Objective:   Physical Exam  Constitutional: He appears well-developed and well-nourished. No distress.  Skin:  Forearms with scaly rash in both a nummular and irregular pattern. Lower legs and upper chest with a mildly scaly papular rash        Assessment:    1. Rash: ? Due to dry skin - Ambulatory referral to Dermatology    Plan:   Discussed use of Dove soap and moisturizing creams.

## 2018-07-17 NOTE — Patient Instructions (Addendum)
Switch to [employer] with moisturizer. Try Cetaphil or Eucerin hypoallergenic moisturizing cream. We will call about the dermatology referral.

## 2018-07-30 ENCOUNTER — Other Ambulatory Visit: Payer: Self-pay | Admitting: Family Medicine

## 2018-07-30 DIAGNOSIS — N529 Male erectile dysfunction, unspecified: Secondary | ICD-10-CM

## 2018-08-22 ENCOUNTER — Other Ambulatory Visit: Payer: Self-pay | Admitting: Family Medicine

## 2018-11-05 ENCOUNTER — Encounter: Payer: Self-pay | Admitting: Physician Assistant

## 2018-11-05 ENCOUNTER — Ambulatory Visit: Payer: Managed Care, Other (non HMO) | Admitting: Physician Assistant

## 2018-11-05 VITALS — BP 138/89 | HR 62 | Temp 97.7°F | Wt 176.8 lb

## 2018-11-05 DIAGNOSIS — M545 Low back pain, unspecified: Secondary | ICD-10-CM

## 2018-11-05 DIAGNOSIS — M25552 Pain in left hip: Secondary | ICD-10-CM

## 2018-11-05 DIAGNOSIS — M25551 Pain in right hip: Secondary | ICD-10-CM | POA: Diagnosis not present

## 2018-11-05 MED ORDER — CYCLOBENZAPRINE HCL 5 MG PO TABS: 5.0000 mg | ORAL_TABLET | Freq: Two times a day (BID) | ORAL | 0 refills | Status: DC | PRN

## 2018-11-05 NOTE — Patient Instructions (Addendum)
Tylenol is the safest medication for arthritis. You can take up to 3g per day.    Hip Pain  The hip is the joint between the upper legs and the lower pelvis. The bones, cartilage, tendons, and muscles of your hip joint support your body and allow you to move around. Hip pain can range from a minor ache to severe pain in one or both of your hips. The pain may be felt on the inside of the hip joint near the groin, or the outside near the buttocks and upper thigh. You may also have swelling or stiffness. Follow these instructions at home: Managing pain, stiffness, and swelling  If directed, apply ice to the injured area. ? Put ice in a plastic bag. ? Place a towel between your skin and the bag. ? Leave the ice on for 20 minutes, 2-3 times a day  Sleep with a pillow between your legs on your most comfortable side.  Avoid any activities that cause pain. General instructions  Take over-the-counter and prescription medicines only as told by your health care provider.  Do any exercises as told by your health care provider.  Record the following: ? How often you have hip pain. ? The location of your pain. ? What the pain feels like. ? What makes the pain worse.  Keep all follow-up visits as told by your health care provider. This is important. Contact a health care provider if:  You cannot put weight on your leg.  Your pain or swelling continues or gets worse after one week.  It gets harder to walk.  You have a fever. Get help right away if:  You fall.  You have a sudden increase in pain and swelling in your hip.  Your hip is red or swollen or very tender to touch. Summary  Hip pain can range from a minor ache to severe pain in one or both of your hips.  The pain may be felt on the inside of the hip joint near the groin, or the outside near the buttocks and upper thigh.  Avoid any activities that cause pain.  Record how often you have hip pain, the location of the pain,  what makes it worse and what it feels like. This information is not intended to replace advice given to you by your health care provider. Make sure you discuss any questions you have with your health care provider. Document Released: 02/23/2010 Document Revised: 08/08/2016 Document Reviewed: 08/08/2016 Elsevier Interactive Patient Education  2019 [employer].

## 2018-11-05 NOTE — Progress Notes (Signed)
Patient: Brandon Galvan Male    DOB: 09/24/1955   63 y.o.   MRN: [id number] Visit Date: 11/05/2018  Today's Provider: Trinna Post, PA-C   Chief Complaint  Patient presents with  . Hip Pain   Subjective:    Patient has a hip of trochanteric bursitis x 2 years. Reports he was doing an activity during his job, hanging something, and pulled muscle. He reports he has had right hip and low back pain times several weeks. He reports he has seen his chiropractor for it who reports he has done something to his back. He reports walking with a noticeable limp. NO prior injuries or surgeries of the area. Reports he has trouble with hip extension in his right hip.  Hip Pain   The incident occurred more than 1 week ago. The pain is present in the right hip. The quality of the pain is described as stabbing and shooting. The pain is at a severity of 7/10. The pain is moderate. The pain has been constant since onset. Associated symptoms include an inability to bear weight. He reports no foreign bodies present. The symptoms are aggravated by movement and weight bearing. He has tried heat, ice and NSAIDs for the symptoms. The treatment provided mild relief.    Allergies  Allergen Reactions  . Penicillin G Hives and Swelling    Pt verbalizes swelling to lips and hives on abd  . Amoxicillin Rash     Current Outpatient Medications:  .  meclizine (ANTIVERT) 25 MG tablet, Take 1 tablet (25 mg total) by mouth 3 (three) times daily as needed for dizziness., Disp: 90 tablet, Rfl: 1 .  meloxicam (MOBIC) 15 MG tablet, TAKE 1 TABLET BY MOUTH DAILY AS NEEDED FOR PAIN, Disp: 30 tablet, Rfl: 0 .  sildenafil (VIAGRA) 50 MG tablet, TAKE 1 TABLET (50 MG TOTAL) BY MOUTH DAILY AS NEEDED FOR ERECTILE DYSFUNCTION., Disp: 6 tablet, Rfl: 5  Review of Systems  Social History   Tobacco Use  . Smoking status: Former Smoker    Types: Cigars  . Smokeless tobacco: Never Used  Substance Use Topics  . Alcohol  use: Not on file      Objective:   BP 138/89 (BP Location: Left Arm, Patient Position: Sitting, Cuff Size: Normal)   Pulse 62   Temp 97.7 F (36.5 C) (Oral)   Wt 176 lb 12.8 oz (80.2 kg)   BMI 27.69 kg/m  Vitals:   11/05/18 1611  BP: 138/89  Pulse: 62  Temp: 97.7 F (36.5 C)  TempSrc: Oral  Weight: 176 lb 12.8 oz (80.2 kg)     Physical Exam Constitutional:      Appearance: Normal appearance.  Musculoskeletal:        General: No swelling or deformity.     Right hip: He exhibits decreased range of motion and tenderness. He exhibits normal strength, no bony tenderness, no swelling, no crepitus, no deformity and no laceration.     Left hip: Normal.     Right lower leg: No edema.     Left lower leg: No edema.     Comments: Decreased right hip extension. Point tenderness over right trochanteric bursa. Negative SLR bilaterally.   Neurological:     Mental Status: He is alert.     Gait: Gait abnormal.     Comments: Walks with limp, favors right side.   Psychiatric:        Mood and Affect: Mood normal.  Behavior: Behavior normal.         Assessment & Plan    1. Hip pain, bilateral  Acute on chronic worsening. Does have some point tenderness over right trochanteric bursa but does not specify this as his area of pain, more his hip joint and radiating into groin. Possible OA, muscle strain, lumbar radiculopathy. Will get xrays as below, alternate tylenol and ibuprofen, start PT. May need ortho referral if this fails.  - DG Hip Unilat W OR W/O Pelvis 2-3 Views Left; Future - DG Hip Unilat W OR W/O Pelvis 2-3 Views Right; Future - DG Lumbar Spine Complete; Future - cyclobenzaprine (FLEXERIL) 5 MG tablet; Take 1 tablet (5 mg total) by mouth 2 (two) times daily as needed for muscle spasms.  Dispense: 30 tablet; Refill: 0 - Ambulatory referral to Physical Therapy  2. Acute midline low back pain without sciatica  - cyclobenzaprine (FLEXERIL) 5 MG tablet; Take 1 tablet (5  mg total) by mouth 2 (two) times daily as needed for muscle spasms.  Dispense: 30 tablet; Refill: 0 - Ambulatory referral to Physical Therapy  The entirety of the information documented in the History of Present Illness, Review of Systems and Physical Exam were personally obtained by me. Portions of this information were initially documented by Lynford Humphrey, CMA and reviewed by me for thoroughness and accuracy.   Return if symptoms worsen or fail to improve.      Trinna Post, PA-C  [facility]

## 2018-11-14 ENCOUNTER — Ambulatory Visit
Admission: RE | Admit: 2018-11-14 | Discharge: 2018-11-14 | Disposition: A | Discharge status: Home / Self Care | Payer: Managed Care, Other (non HMO) | Attending: Physician Assistant | Admitting: Physician Assistant

## 2018-11-14 ENCOUNTER — Ambulatory Visit
Admission: RE | Admit: 2018-11-14 | Discharge: 2018-11-14 | Disposition: A | Discharge status: Home / Self Care | Payer: Managed Care, Other (non HMO) | Source: Ambulatory Visit | Attending: Physician Assistant | Admitting: Physician Assistant

## 2018-11-14 DIAGNOSIS — M25551 Pain in right hip: Secondary | ICD-10-CM | POA: Insufficient documentation

## 2018-11-14 DIAGNOSIS — M25552 Pain in left hip: Principal | ICD-10-CM

## 2018-11-14 DIAGNOSIS — M25559 Pain in unspecified hip: Secondary | ICD-10-CM | POA: Diagnosis present

## 2018-11-16 ENCOUNTER — Telehealth: Payer: Self-pay

## 2018-11-16 NOTE — Telephone Encounter (Signed)
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 Otherwise no fractures.

## 2018-11-16 NOTE — Telephone Encounter (Signed)
lmtcb

## 2018-11-16 NOTE — Telephone Encounter (Signed)
-----   Message from Trinna Post, [place] sent at 11/16/2018  9:56 AM EST ----- Some arthritic changes in hip joint, right slightly worse than left. Otherwise no fracture.

## 2018-11-16 NOTE — Telephone Encounter (Signed)
Patient advised as below.  

## 2018-12-27 DIAGNOSIS — M1611 Unilateral primary osteoarthritis, right hip: Secondary | ICD-10-CM | POA: Insufficient documentation

## 2018-12-27 DIAGNOSIS — D369 Benign neoplasm, unspecified site: Secondary | ICD-10-CM | POA: Insufficient documentation

## 2019-01-15 ENCOUNTER — Other Ambulatory Visit: Payer: Self-pay | Admitting: Internal Medicine

## 2019-01-15 DIAGNOSIS — N3090 Cystitis, unspecified without hematuria: Secondary | ICD-10-CM

## 2019-01-22 ENCOUNTER — Other Ambulatory Visit: Payer: Self-pay

## 2019-01-22 ENCOUNTER — Ambulatory Visit
Admission: RE | Admit: 2019-01-22 | Discharge: 2019-01-22 | Disposition: A | Discharge status: Home / Self Care | Payer: Managed Care, Other (non HMO) | Source: Ambulatory Visit | Attending: Internal Medicine | Admitting: Internal Medicine

## 2019-01-22 DIAGNOSIS — N3090 Cystitis, unspecified without hematuria: Secondary | ICD-10-CM | POA: Diagnosis present

## 2019-04-16 ENCOUNTER — Encounter
Admission: RE | Admit: 2019-04-16 | Discharge: 2019-04-16 | Disposition: A | Discharge status: Home / Self Care | Payer: Managed Care, Other (non HMO) | Source: Ambulatory Visit | Attending: Orthopedic Surgery | Admitting: Orthopedic Surgery

## 2019-04-16 ENCOUNTER — Other Ambulatory Visit: Payer: Self-pay

## 2019-04-16 DIAGNOSIS — Z01812 Encounter for preprocedural laboratory examination: Secondary | ICD-10-CM | POA: Diagnosis not present

## 2019-04-16 HISTORY — DX: Unspecified osteoarthritis, unspecified site: M19.90

## 2019-04-16 HISTORY — DX: Personal history of urinary calculi: Z87.442

## 2019-04-16 HISTORY — DX: Depression, unspecified: F32.A

## 2019-04-16 LAB — APTT: aPTT: 28 seconds (ref 24–36)

## 2019-04-16 LAB — BASIC METABOLIC PANEL
Anion gap: 8 (ref 5–15)
BUN: 19 mg/dL (ref 8–23)
CO2: 26 mmol/L (ref 22–32)
Calcium: 9.3 mg/dL (ref 8.9–10.3)
Chloride: 105 mmol/L (ref 98–111)
Creatinine, Ser: 0.83 mg/dL (ref 0.61–1.24)
GFR calc Af Amer: >60 mL/min (ref >60)
GFR calc non Af Amer: >60 mL/min (ref >60)
Glucose, Bld: 94 mg/dL (ref 70–99)
Potassium: 4.5 mmol/L (ref 3.5–5.1)
Sodium: 139 mmol/L (ref 135–145)

## 2019-04-16 LAB — CBC
HCT: 44.3 % (ref 39.0–52.0)
Hemoglobin: 15.4 g/dL (ref 13.0–17.0)
MCH: 32.2 pg (ref 26.0–34.0)
MCHC: 34.8 g/dL (ref 30.0–36.0)
MCV: 92.7 fL (ref 80.0–100.0)
Platelets: 236 10*3/uL (ref 150–400)
RBC: 4.78 MIL/uL (ref 4.22–5.81)
RDW: 13.1 % (ref 11.5–15.5)
WBC: 7.4 10*3/uL (ref 4.0–10.5)
nRBC: 0 % (ref 0.0–0.2)

## 2019-04-16 LAB — SURGICAL PCR SCREEN
MRSA, PCR: NEGATIVE
Staphylococcus aureus: NEGATIVE

## 2019-04-16 LAB — URINALYSIS, ROUTINE W REFLEX MICROSCOPIC
Bilirubin Urine: NEGATIVE
Glucose, UA: NEGATIVE mg/dL
Hgb urine dipstick: NEGATIVE
Ketones, ur: NEGATIVE mg/dL
Leukocytes,Ua: NEGATIVE
Nitrite: NEGATIVE
Protein, ur: NEGATIVE mg/dL
Specific Gravity, Urine: 1.019 (ref 1.005–1.030)
pH: 6 (ref 5.0–8.0)

## 2019-04-16 LAB — TYPE AND SCREEN
ABO/RH(D): A POS
Antibody Screen: NEGATIVE

## 2019-04-16 LAB — PROTIME-INR
INR: 1 (ref 0.8–1.2)
Prothrombin Time: 13.3 seconds (ref 11.4–15.2)

## 2019-04-16 LAB — SEDIMENTATION RATE: Sed Rate: 3 mm/hr (ref 0–20)

## 2019-04-16 NOTE — Patient Instructions (Addendum)
Your procedure is scheduled on: Thursday April 25, 2019 Report to Same Day Surgery 2nd floor [facility] Entrance-take elevator on left to 2nd floor.  Check in with surgery information desk.) To find out your arrival time, call [phone] 1:00-3:00 PM on Wednesday April 24, 2019  Remember: Instructions that are not followed completely may result in serious medical risk, up to and including death, or upon the discretion of your surgeon and anesthesiologist your surgery may need to be rescheduled.    __x__ 1. Do not eat food (including mints, candies, chewing gum) after midnight the night before your procedure. You may drink clear liquids up to 2 hours before you are scheduled to arrive at the hospital for your procedure.  Do not drink anything within 2 hours of your scheduled arrival to the hospital.  Approved clear liquids:  --Water or Apple juice without pulp  --Clear carbohydrate beverage such as Gatorade or Powerade  --Black Coffee or Clear Tea (No milk, no creamers, do not add anything to the coffee or tea)    __x__ 2. No Alcohol for 24 hours before or after surgery.   __x__ 3. No Smoking or e-cigarettes for 24 hours before surgery.  Do not use any chewable tobacco products for at least 6 hours before surgery.   __x__ 4. Notify your doctor if there is any change in your medical condition (cold, fever, infections).   __x__ 5. On the morning of surgery brush your teeth with toothpaste and water.  You may rinse your mouth with mouthwash if you wish.  Do not swallow any toothpaste or mouthwash.  Please read over the following fact sheets that you were given:   [facility] Preparing for Surgery and/or MRSA Information    __x__ Use CHG Soap as directed on instruction sheet.   Do not wear jewelry on the day of surgery.  Do not wear lotions, powders, deodorant, or perfumes.   Do not shave below the face/neck 48 hours prior to surgery.   Do not bring valuables to the  hospital.    [facility] is not responsible for any belongings or valuables.               Contacts, dentures or bridgework may not be worn into surgery.  Leave your suitcase in the car. After surgery it may be brought to your room.  For patients admitted to the hospital, discharge time is determined by your treatment team.   __x__ Take these medicines on the morning of surgery with a SMALL SIP OF WATER:  1. FLOMAX (TAMSULOSIN)  2. ROBAXIN (METHOCARBAMOL)  3.  4.  5.  6.  ____ Use inhalers on the day of surgery and bring them with you to the hospital.  ____ Bring C-Pap/Bi-Pap machine to the hospital.   ____ Stop Metformin and Janumet 2 days before surgery (Last dose          ).    ____ Take 1/2 of usual insulin dose the night before surgery and none on the morning of surgery.   ____ Follow recommendations from Cardiologist, Pulmonologist or PCP regarding stopping blood thinners such as Aspirin, Coumadin, Plavix, Eliquis, Effient, Pradaxa, and Pletal.  __x__ STARTING TODAY: Do not take Anti-inflammatories such as MELOXICAM, Advil, Ibuprofen, Motrin, Aleve, Naproxen, Naprosyn, BC/Goodies powders or aspirin products. You may continue to take Tylenol    __x__ STARTING TODAY: Do not take over the counter supplements until after surgery. You may continue to take Vitamin D, Vitamin  B, and multivitamin.

## 2019-04-17 LAB — URINE CULTURE: Culture: NO GROWTH

## 2019-04-18 ENCOUNTER — Other Ambulatory Visit: Payer: Managed Care, Other (non HMO)

## 2019-04-22 ENCOUNTER — Other Ambulatory Visit
Admission: RE | Admit: 2019-04-22 | Discharge: 2019-04-22 | Disposition: A | Discharge status: Home / Self Care | Payer: Managed Care, Other (non HMO) | Source: Ambulatory Visit | Attending: Orthopedic Surgery | Admitting: Orthopedic Surgery

## 2019-04-22 ENCOUNTER — Other Ambulatory Visit: Payer: Self-pay

## 2019-04-22 DIAGNOSIS — Z01812 Encounter for preprocedural laboratory examination: Secondary | ICD-10-CM | POA: Diagnosis present

## 2019-04-22 DIAGNOSIS — Z20828 Contact with and (suspected) exposure to other viral communicable diseases: Secondary | ICD-10-CM | POA: Diagnosis not present

## 2019-04-22 LAB — SARS CORONAVIRUS 2 (TAT 6-24 HRS): SARS Coronavirus 2: NEGATIVE

## 2019-04-24 MED ORDER — CLINDAMYCIN PHOSPHATE 900 MG/50ML IV SOLN
900.0000 mg | Freq: Once | INTRAVENOUS | Status: AC
  Administered 2019-04-25: 900 mg via INTRAVENOUS

## 2019-04-25 ENCOUNTER — Inpatient Hospital Stay: Payer: No Typology Code available for payment source | Admitting: Anesthesiology

## 2019-04-25 ENCOUNTER — Inpatient Hospital Stay: Payer: No Typology Code available for payment source

## 2019-04-25 ENCOUNTER — Encounter
Admission: RE | Disposition: A | Discharge status: Home Health | Payer: Self-pay | Source: Home / Self Care | Attending: Orthopedic Surgery

## 2019-04-25 ENCOUNTER — Encounter: Payer: Self-pay | Admitting: *Deleted

## 2019-04-25 ENCOUNTER — Inpatient Hospital Stay
Admission: RE | Admit: 2019-04-25 | Discharge: 2019-04-27 | DRG: 470 | Disposition: A | Discharge status: Home Health | Payer: No Typology Code available for payment source | Attending: Orthopedic Surgery | Admitting: Orthopedic Surgery

## 2019-04-25 ENCOUNTER — Other Ambulatory Visit: Payer: Self-pay

## 2019-04-25 DIAGNOSIS — Z8249 Family history of ischemic heart disease and other diseases of the circulatory system: Secondary | ICD-10-CM | POA: Diagnosis not present

## 2019-04-25 DIAGNOSIS — Z833 Family history of diabetes mellitus: Secondary | ICD-10-CM | POA: Diagnosis not present

## 2019-04-25 DIAGNOSIS — Z79899 Other long term (current) drug therapy: Secondary | ICD-10-CM

## 2019-04-25 DIAGNOSIS — Z419 Encounter for procedure for purposes other than remedying health state, unspecified: Secondary | ICD-10-CM

## 2019-04-25 DIAGNOSIS — E785 Hyperlipidemia, unspecified: Secondary | ICD-10-CM | POA: Diagnosis present

## 2019-04-25 DIAGNOSIS — M160 Bilateral primary osteoarthritis of hip: Principal | ICD-10-CM | POA: Diagnosis present

## 2019-04-25 DIAGNOSIS — Z791 Long term (current) use of non-steroidal anti-inflammatories (NSAID): Secondary | ICD-10-CM

## 2019-04-25 DIAGNOSIS — R509 Fever, unspecified: Secondary | ICD-10-CM | POA: Diagnosis not present

## 2019-04-25 DIAGNOSIS — G8918 Other acute postprocedural pain: Secondary | ICD-10-CM

## 2019-04-25 DIAGNOSIS — Z87442 Personal history of urinary calculi: Secondary | ICD-10-CM | POA: Diagnosis not present

## 2019-04-25 DIAGNOSIS — Z96641 Presence of right artificial hip joint: Secondary | ICD-10-CM

## 2019-04-25 HISTORY — PX: TOTAL HIP ARTHROPLASTY: SHX124

## 2019-04-25 LAB — CBC
HCT: 42.6 % (ref 39.0–52.0)
Hemoglobin: 14.7 g/dL (ref 13.0–17.0)
MCH: 32.5 pg (ref 26.0–34.0)
MCHC: 34.5 g/dL (ref 30.0–36.0)
MCV: 94.2 fL (ref 80.0–100.0)
Platelets: 221 10*3/uL (ref 150–400)
RBC: 4.52 MIL/uL (ref 4.22–5.81)
RDW: 13.1 % (ref 11.5–15.5)
WBC: 13.5 10*3/uL — ABNORMAL HIGH (ref 4.0–10.5)
nRBC: 0 % (ref 0.0–0.2)

## 2019-04-25 LAB — ABO/RH: ABO/RH(D): A POS

## 2019-04-25 LAB — CREATININE, SERUM
Creatinine, Ser: 0.75 mg/dL (ref 0.61–1.24)
GFR calc Af Amer: >60 mL/min (ref >60)
GFR calc non Af Amer: >60 mL/min (ref >60)

## 2019-04-25 SURGERY — ARTHROPLASTY, HIP, TOTAL, ANTERIOR APPROACH
Anesthesia: Spinal | Site: Hip | Laterality: Right

## 2019-04-25 MED ORDER — FENTANYL CITRATE (PF) 100 MCG/2ML IJ SOLN: 25.0000 ug | INTRAMUSCULAR | Status: DC | PRN

## 2019-04-25 MED ORDER — SODIUM CHLORIDE 0.9 % IV SOLN
INTRAVENOUS | Status: DC | PRN
  Administered 2019-04-25: 60 mL

## 2019-04-25 MED ORDER — BUPIVACAINE-EPINEPHRINE (PF) 0.25% -1:200000 IJ SOLN
INTRAMUSCULAR | Status: AC
  Filled 2019-04-25: qty 30

## 2019-04-25 MED ORDER — MAGNESIUM HYDROXIDE 400 MG/5ML PO SUSP
30.0000 mL | Freq: Every day | ORAL | Status: DC | PRN
  Administered 2019-04-26: 30 mL via ORAL
  Filled 2019-04-25: qty 30

## 2019-04-25 MED ORDER — METOCLOPRAMIDE HCL 10 MG PO TABS: 5.0000 mg | ORAL_TABLET | Freq: Three times a day (TID) | ORAL | Status: DC | PRN

## 2019-04-25 MED ORDER — METHOCARBAMOL 500 MG PO TABS
500.0000 mg | ORAL_TABLET | Freq: Four times a day (QID) | ORAL | Status: DC | PRN
  Administered 2019-04-26 – 2019-04-27 (×3): 500 mg via ORAL
  Filled 2019-04-25 (×4): qty 1

## 2019-04-25 MED ORDER — MENTHOL 3 MG MT LOZG
1.0000 | LOZENGE | OROMUCOSAL | Status: DC | PRN
  Filled 2019-04-25: qty 9

## 2019-04-25 MED ORDER — FAMOTIDINE 20 MG PO TABS
ORAL_TABLET | ORAL | Status: AC
  Administered 2019-04-25: 07:00:00 20 mg via ORAL
  Filled 2019-04-25: qty 1

## 2019-04-25 MED ORDER — MIDAZOLAM HCL 5 MG/5ML IJ SOLN
INTRAMUSCULAR | Status: DC | PRN
  Administered 2019-04-25: 2 mg via INTRAVENOUS

## 2019-04-25 MED ORDER — PROPOFOL 500 MG/50ML IV EMUL
INTRAVENOUS | Status: AC
  Filled 2019-04-25: qty 50

## 2019-04-25 MED ORDER — ACETAMINOPHEN 325 MG PO TABS
325.0000 mg | ORAL_TABLET | Freq: Four times a day (QID) | ORAL | Status: DC | PRN
  Administered 2019-04-27: 650 mg via ORAL
  Filled 2019-04-25: qty 2

## 2019-04-25 MED ORDER — PHENOL 1.4 % MT LIQD
1.0000 | OROMUCOSAL | Status: DC | PRN
  Filled 2019-04-25: qty 177

## 2019-04-25 MED ORDER — PHENYLEPHRINE HCL (PRESSORS) 10 MG/ML IV SOLN
INTRAVENOUS | Status: AC
  Filled 2019-04-25: qty 1

## 2019-04-25 MED ORDER — BUPIVACAINE HCL (PF) 0.5 % IJ SOLN
INTRAMUSCULAR | Status: DC | PRN
  Administered 2019-04-25: 3 mL

## 2019-04-25 MED ORDER — NEOMYCIN-POLYMYXIN B GU 40-200000 IR SOLN
Status: DC | PRN
  Administered 2019-04-25: 4 mL

## 2019-04-25 MED ORDER — METHOCARBAMOL 1000 MG/10ML IJ SOLN
500.0000 mg | Freq: Four times a day (QID) | INTRAVENOUS | Status: DC | PRN
  Filled 2019-04-25: qty 5

## 2019-04-25 MED ORDER — BISACODYL 5 MG PO TBEC
5.0000 mg | DELAYED_RELEASE_TABLET | Freq: Every day | ORAL | Status: DC | PRN
  Administered 2019-04-26: 5 mg via ORAL
  Filled 2019-04-25: qty 1

## 2019-04-25 MED ORDER — CLINDAMYCIN PHOSPHATE 600 MG/50ML IV SOLN
600.0000 mg | Freq: Four times a day (QID) | INTRAVENOUS | Status: AC
  Administered 2019-04-25 (×3): 600 mg via INTRAVENOUS
  Filled 2019-04-25 (×3): qty 50

## 2019-04-25 MED ORDER — ACETAMINOPHEN 10 MG/ML IV SOLN
INTRAVENOUS | Status: DC | PRN
  Administered 2019-04-25: 1000 mg via INTRAVENOUS

## 2019-04-25 MED ORDER — HYDROCODONE-ACETAMINOPHEN 5-325 MG PO TABS
1.0000 | ORAL_TABLET | ORAL | Status: DC | PRN
  Administered 2019-04-25: 2 via ORAL
  Administered 2019-04-26: 1 via ORAL
  Filled 2019-04-25 (×2): qty 2

## 2019-04-25 MED ORDER — PROPOFOL 500 MG/50ML IV EMUL
INTRAVENOUS | Status: DC | PRN
  Administered 2019-04-25: 50 ug/kg/min via INTRAVENOUS

## 2019-04-25 MED ORDER — HYDROCODONE-ACETAMINOPHEN 7.5-325 MG PO TABS
1.0000 | ORAL_TABLET | ORAL | Status: DC | PRN
  Administered 2019-04-25: 1 via ORAL
  Filled 2019-04-25: qty 2
  Filled 2019-04-25 (×2): qty 1

## 2019-04-25 MED ORDER — SODIUM CHLORIDE 0.9 % IV SOLN
INTRAVENOUS | Status: DC
  Administered 2019-04-25 (×2): via INTRAVENOUS

## 2019-04-25 MED ORDER — MORPHINE SULFATE (PF) 4 MG/ML IV SOLN: 0.5000 mg | INTRAVENOUS | Status: DC | PRN

## 2019-04-25 MED ORDER — SODIUM CHLORIDE 0.9 % IV SOLN
INTRAVENOUS | Status: DC | PRN
  Administered 2019-04-25: 40 ug/min via INTRAVENOUS

## 2019-04-25 MED ORDER — FAMOTIDINE 20 MG PO TABS
20.0000 mg | ORAL_TABLET | Freq: Once | ORAL | Status: AC
  Administered 2019-04-25: 20 mg via ORAL

## 2019-04-25 MED ORDER — CLINDAMYCIN PHOSPHATE 900 MG/50ML IV SOLN
INTRAVENOUS | Status: AC
  Filled 2019-04-25: qty 50

## 2019-04-25 MED ORDER — BUPIVACAINE LIPOSOME 1.3 % IJ SUSP
INTRAMUSCULAR | Status: AC
  Filled 2019-04-25: qty 20

## 2019-04-25 MED ORDER — TRAMADOL HCL 50 MG PO TABS
50.0000 mg | ORAL_TABLET | Freq: Four times a day (QID) | ORAL | Status: DC
  Administered 2019-04-25 – 2019-04-27 (×8): 50 mg via ORAL
  Filled 2019-04-25 (×9): qty 1

## 2019-04-25 MED ORDER — MIDAZOLAM HCL 2 MG/2ML IJ SOLN
INTRAMUSCULAR | Status: AC
  Filled 2019-04-25: qty 2

## 2019-04-25 MED ORDER — BUPIVACAINE HCL (PF) 0.5 % IJ SOLN
INTRAMUSCULAR | Status: AC
  Filled 2019-04-25: qty 10

## 2019-04-25 MED ORDER — LIDOCAINE HCL (PF) 2 % IJ SOLN
INTRAMUSCULAR | Status: AC
  Filled 2019-04-25: qty 10

## 2019-04-25 MED ORDER — ALUM & MAG HYDROXIDE-SIMETH 200-200-20 MG/5ML PO SUSP: 30.0000 mL | ORAL | Status: DC | PRN

## 2019-04-25 MED ORDER — METOCLOPRAMIDE HCL 5 MG/ML IJ SOLN: 5.0000 mg | Freq: Three times a day (TID) | INTRAMUSCULAR | Status: DC | PRN

## 2019-04-25 MED ORDER — BUPIVACAINE-EPINEPHRINE 0.25% -1:200000 IJ SOLN
INTRAMUSCULAR | Status: DC | PRN
  Administered 2019-04-25: 30 mL

## 2019-04-25 MED ORDER — ONDANSETRON HCL 4 MG/2ML IJ SOLN: 4.0000 mg | Freq: Once | INTRAMUSCULAR | Status: DC | PRN

## 2019-04-25 MED ORDER — GLYCOPYRROLATE 0.2 MG/ML IJ SOLN
INTRAMUSCULAR | Status: DC | PRN
  Administered 2019-04-25: 0.2 mg via INTRAVENOUS

## 2019-04-25 MED ORDER — ACETAMINOPHEN 10 MG/ML IV SOLN
INTRAVENOUS | Status: AC
  Filled 2019-04-25: qty 100

## 2019-04-25 MED ORDER — TRANEXAMIC ACID-NACL 1000-0.7 MG/100ML-% IV SOLN
1000.0000 mg | INTRAVENOUS | Status: AC
  Administered 2019-04-25: 15 mg via INTRAVENOUS
  Filled 2019-04-25: qty 100

## 2019-04-25 MED ORDER — ONDANSETRON HCL 4 MG/2ML IJ SOLN: 4.0000 mg | Freq: Four times a day (QID) | INTRAMUSCULAR | Status: DC | PRN

## 2019-04-25 MED ORDER — TAMSULOSIN HCL 0.4 MG PO CAPS
0.4000 mg | ORAL_CAPSULE | Freq: Every day | ORAL | Status: DC
  Administered 2019-04-26 – 2019-04-27 (×2): 0.4 mg via ORAL
  Filled 2019-04-25 (×2): qty 1

## 2019-04-25 MED ORDER — OXYCODONE HCL 5 MG PO TABS
ORAL_TABLET | ORAL | Status: AC
  Administered 2019-04-25: 5 mg via ORAL
  Filled 2019-04-25: qty 1

## 2019-04-25 MED ORDER — ONDANSETRON HCL 4 MG/2ML IJ SOLN
INTRAMUSCULAR | Status: AC
  Filled 2019-04-25: qty 2

## 2019-04-25 MED ORDER — LIDOCAINE HCL (CARDIAC) PF 100 MG/5ML IV SOSY
PREFILLED_SYRINGE | INTRAVENOUS | Status: DC | PRN
  Administered 2019-04-25: 60 mg via INTRAVENOUS

## 2019-04-25 MED ORDER — OXYCODONE HCL 5 MG PO TABS
5.0000 mg | ORAL_TABLET | Freq: Once | ORAL | Status: AC
  Administered 2019-04-25: 5 mg via ORAL

## 2019-04-25 MED ORDER — DOCUSATE SODIUM 100 MG PO CAPS
100.0000 mg | ORAL_CAPSULE | Freq: Two times a day (BID) | ORAL | Status: DC
  Administered 2019-04-25 – 2019-04-27 (×4): 100 mg via ORAL
  Filled 2019-04-25 (×4): qty 1

## 2019-04-25 MED ORDER — LACTATED RINGERS IV SOLN
INTRAVENOUS | Status: DC
  Administered 2019-04-25: 07:00:00 via INTRAVENOUS

## 2019-04-25 MED ORDER — ONDANSETRON HCL 4 MG/2ML IJ SOLN
INTRAMUSCULAR | Status: DC | PRN
  Administered 2019-04-25: 4 mg via INTRAVENOUS

## 2019-04-25 MED ORDER — ZOLPIDEM TARTRATE 5 MG PO TABS: 5.0000 mg | ORAL_TABLET | Freq: Every evening | ORAL | Status: DC | PRN

## 2019-04-25 MED ORDER — TRIAMCINOLONE ACETONIDE 0.1 % EX CREA
1.0000 "application " | TOPICAL_CREAM | Freq: Every day | CUTANEOUS | Status: DC
  Filled 2019-04-25: qty 15

## 2019-04-25 MED ORDER — NEOMYCIN-POLYMYXIN B GU 40-200000 IR SOLN
Status: AC
  Filled 2019-04-25: qty 4

## 2019-04-25 MED ORDER — ONDANSETRON HCL 4 MG PO TABS: 4.0000 mg | ORAL_TABLET | Freq: Four times a day (QID) | ORAL | Status: DC | PRN

## 2019-04-25 MED ORDER — ENOXAPARIN SODIUM 40 MG/0.4ML ~~LOC~~ SOLN
40.0000 mg | SUBCUTANEOUS | Status: DC
  Administered 2019-04-26 – 2019-04-27 (×2): 40 mg via SUBCUTANEOUS
  Filled 2019-04-25 (×2): qty 0.4

## 2019-04-25 MED ORDER — MAGNESIUM CITRATE PO SOLN
1.0000 | Freq: Once | ORAL | Status: AC | PRN
  Administered 2019-04-27: 1 via ORAL
  Filled 2019-04-25 (×2): qty 296

## 2019-04-25 MED ORDER — SODIUM CHLORIDE FLUSH 0.9 % IV SOLN
INTRAVENOUS | Status: AC
  Filled 2019-04-25: qty 40

## 2019-04-25 MED ORDER — ACETAMINOPHEN 500 MG PO TABS
500.0000 mg | ORAL_TABLET | Freq: Four times a day (QID) | ORAL | Status: AC
  Administered 2019-04-25 – 2019-04-26 (×4): 500 mg via ORAL
  Filled 2019-04-25 (×4): qty 1

## 2019-04-25 SURGICAL SUPPLY — 59 items
BLADE SAGITTAL AGGR TOOTH XLG (BLADE) ×3 IMPLANT
BNDG COHESIVE 6X5 TAN STRL LF (GAUZE/BANDAGES/DRESSINGS) ×9 IMPLANT
CANISTER SUCT 1200ML W/VALVE (MISCELLANEOUS) ×3 IMPLANT
CANISTER WOUND CARE 500ML ATS (WOUND CARE) ×3 IMPLANT
CHLORAPREP W/TINT 26 (MISCELLANEOUS) ×3 IMPLANT
COVER BACK TABLE REUSABLE LG (DRAPES) ×3 IMPLANT
COVER WAND RF STERILE (DRAPES) ×3 IMPLANT
DRAPE 3/4 80X56 (DRAPES) ×9 IMPLANT
DRAPE C-ARM XRAY 36X54 (DRAPES) ×3 IMPLANT
DRAPE INCISE IOBAN 66X60 STRL (DRAPES) IMPLANT
DRAPE POUCH INSTRU U-SHP 10X18 (DRAPES) ×3 IMPLANT
DRESSING SURGICEL FIBRLLR 1X2 (HEMOSTASIS) ×2 IMPLANT
DRSG OPSITE POSTOP 4X8 (GAUZE/BANDAGES/DRESSINGS) ×6 IMPLANT
DRSG SURGICEL FIBRILLAR 1X2 (HEMOSTASIS) ×6
ELECT BLADE 6.5 EXT (BLADE) ×3 IMPLANT
ELECT REM PT RETURN 9FT ADLT (ELECTROSURGICAL) ×3
ELECTRODE REM PT RTRN 9FT ADLT (ELECTROSURGICAL) ×1 IMPLANT
GLOVE BIOGEL PI IND STRL 9 (GLOVE) ×1 IMPLANT
GLOVE BIOGEL PI INDICATOR 9 (GLOVE) ×2
GLOVE SURG SYN 9.0  PF PI (GLOVE) ×4
GLOVE SURG SYN 9.0 PF PI (GLOVE) ×2 IMPLANT
GOWN SRG 2XL LVL 4 RGLN SLV (GOWNS) ×1 IMPLANT
GOWN STRL NON-REIN 2XL LVL4 (GOWNS) ×2
GOWN STRL REUS W/ TWL LRG LVL3 (GOWN DISPOSABLE) ×1 IMPLANT
GOWN STRL REUS W/TWL LRG LVL3 (GOWN DISPOSABLE) ×2
HEAD FEMORAL 28MM SZ S (Head) ×3 IMPLANT
HEMOVAC 400CC 10FR (MISCELLANEOUS) IMPLANT
HIP DBL LINER 54X28 (Liner) ×3 IMPLANT
HOLDER FOLEY CATH W/STRAP (MISCELLANEOUS) ×3 IMPLANT
HOOD PEEL AWAY FLYTE STAYCOOL (MISCELLANEOUS) ×3 IMPLANT
KIT PREVENA INCISION MGT 13 (CANNISTER) ×3 IMPLANT
MAT ABSORB  FLUID 56X50 GRAY (MISCELLANEOUS) ×2
MAT ABSORB FLUID 56X50 GRAY (MISCELLANEOUS) ×1 IMPLANT
NDL SAFETY ECLIPSE 18X1.5 (NEEDLE) ×1 IMPLANT
NEEDLE HYPO 18GX1.5 SHARP (NEEDLE) ×2
NEEDLE SPNL 20GX3.5 QUINCKE YW (NEEDLE) ×6 IMPLANT
NS IRRIG 1000ML POUR BTL (IV SOLUTION) ×3 IMPLANT
PACK HIP COMPR (MISCELLANEOUS) ×3 IMPLANT
SCALPEL PROTECTED #10 DISP (BLADE) ×6 IMPLANT
SHELL ACETABULAR SZ 54 DM (Shell) ×3 IMPLANT
SOL PREP PVP 2OZ (MISCELLANEOUS) ×3
SOLUTION PREP PVP 2OZ (MISCELLANEOUS) ×1 IMPLANT
SPONGE DRAIN TRACH 4X4 STRL 2S (GAUZE/BANDAGES/DRESSINGS) ×3 IMPLANT
STAPLER SKIN PROX 35W (STAPLE) ×3 IMPLANT
STEM FEMORAL 4 STD COLLARED (Stem) ×3 IMPLANT
STRAP SAFETY 5IN WIDE (MISCELLANEOUS) ×3 IMPLANT
SUT DVC 2 QUILL PDO  T11 36X36 (SUTURE) ×2
SUT DVC 2 QUILL PDO T11 36X36 (SUTURE) ×1 IMPLANT
SUT SILK 0 (SUTURE) ×2
SUT SILK 0 30XBRD TIE 6 (SUTURE) ×1 IMPLANT
SUT V-LOC 90 ABS DVC 3-0 CL (SUTURE) ×3 IMPLANT
SUT VIC AB 1 CT1 36 (SUTURE) ×3 IMPLANT
SYR 20ML LL LF (SYRINGE) ×3 IMPLANT
SYR 30ML LL (SYRINGE) ×3 IMPLANT
SYR 50ML LL SCALE MARK (SYRINGE) ×6 IMPLANT
SYR BULB IRRIG 60ML STRL (SYRINGE) ×3 IMPLANT
TAPE MICROFOAM 4IN (TAPE) ×3 IMPLANT
TOWEL OR 17X26 4PK STRL BLUE (TOWEL DISPOSABLE) ×3 IMPLANT
TRAY FOLEY MTR SLVR 16FR STAT (SET/KITS/TRAYS/PACK) ×3 IMPLANT

## 2019-04-25 NOTE — Op Note (Signed)
04/25/2019  9:02 AM  PATIENT:  Brandon Galvan  63 y.o. male  PRE-OPERATIVE DIAGNOSIS:  PRIMARY OSTEOARTHRITIS OF RIGHT HIP  POST-OPERATIVE DIAGNOSIS:  PRIMARY OSTEOARTHRITIS OF RIGHT HIP  PROCEDURE:  Procedure(s): TOTAL HIP ARTHROPLASTY ANTERIOR APPROACH (Right)  SURGEON: Laurene Footman, MD  ASSISTANTS: none  ANESTHESIA:   spinal  EBL:  Total I/O In: 400 [I.V.:400] Out: 150 [Urine:100; Blood:50]  BLOOD ADMINISTERED:none  DRAINS: none   LOCAL MEDICATIONS USED:  MARCAINE    and OTHER Exparel  SPECIMEN:  Source of Specimen:  Right femoral head  DISPOSITION OF SPECIMEN:  PATHOLOGY  COUNTS:  YES  TOURNIQUET:  * No tourniquets in log *  IMPLANTS: Medacta AMIS 4 std stem, 54 Mpact DM cup and liner, S 28 mm ceramic head  DICTATION: .Dragon Dictation   The patient was brought to the operating room and after spinal anesthesia was obtained patient was placed on the operative table with the ipsilateral foot into the Medacta attachment, contralateral leg on a well-padded table. C-arm was brought in and preop template x-ray taken. After prepping and draping in usual sterile fashion appropriate patient identification and timeout procedures were completed. Anterior approach to the hip was obtained and centered over the greater trochanter and TFL muscle. The subcutaneous tissue was incised hemostasis being achieved by electrocautery. TFL fascia was incised and the muscle retracted laterally deep retractor placed. The lateral femoral circumflex vessels were identified and ligated. The anterior capsule was exposed and a capsulotomy performed. The neck was identified and a femoral neck cut carried out with a saw. The head was removed without difficulty and showed sclerotic femoral head and acetabulum. Reaming was carried out to 52 mm and a 54 mm cup trial gave appropriate tightness to the acetabular component a T4 DM cup was impacted into position. The leg was then externally rotated and  ischiofemoral and pubofemoral releases carried out. The femur was sequentially broached to a size 4, size 4 standard and as had trials were placed and the final components chosen. The 4 standard stem was inserted along with a ceramic S 28 mm head and 54 okay mm liner. The hip was reduced and was stable the wound was thoroughly irrigated with fibrillar placed along the posterior capsule and medial neck. The deep fascia ws closed using a heavy Quill after infiltration of 30 cc of quarter percent Sensorcaine with epinephrine.3-0 V-loc to close the skin with skin staples.  Exparel was injected throughout the case for postop analgesia   patient was sent to recovery in stable condition.   PLAN OF CARE: Admit to inpatient

## 2019-04-25 NOTE — H&P (Signed)
Reviewed paper H+P, will be scanned into chart. No changes noted.  

## 2019-04-25 NOTE — Anesthesia Preprocedure Evaluation (Addendum)
Anesthesia Evaluation  Patient identified by MRN, date of birth, ID band Patient awake    Reviewed: Allergy & Precautions, NPO status , Patient's Chart, lab work & pertinent test results  History of Anesthesia Complications Negative for: history of anesthetic complications  Airway Mallampati: II       Dental   Pulmonary neg sleep apnea, neg COPD, Not current smoker, former smoker,           Cardiovascular (-) hypertension(-) Past MI and (-) CHF (-) dysrhythmias (-) Valvular Problems/Murmurs     Neuro/Psych neg Seizures Depression    GI/Hepatic Neg liver ROS, neg GERD  ,  Endo/Other  neg diabetes  Renal/GU Renal disease (stones)     Musculoskeletal   Abdominal   Peds  Hematology   Anesthesia Other Findings   Reproductive/Obstetrics                             Anesthesia Physical Anesthesia Plan  ASA: II  Anesthesia Plan: Spinal   Post-op Pain Management:    Induction: Intravenous  PONV Risk Score and Plan:   Airway Management Planned:   Additional Equipment:   Intra-op Plan:   Post-operative Plan:   Informed Consent: I have reviewed the patients History and Physical, chart, labs and discussed the procedure including the risks, benefits and alternatives for the proposed anesthesia with the patient or authorized representative who has indicated his/her understanding and acceptance.       Plan Discussed with:   Anesthesia Plan Comments:        Anesthesia Quick Evaluation

## 2019-04-25 NOTE — Anesthesia Procedure Notes (Addendum)
Spinal  Patient location during procedure: OR Start time: 04/25/2019 7:21 AM End time: 04/25/2019 7:25 AM Staffing Anesthesiologist: Gunnar Fusi, MD Resident/CRNA: Eben Burow, CRNA Performed: resident/CRNA  Preanesthetic Checklist Completed: patient identified, site marked, surgical consent, pre-op evaluation, timeout performed, IV checked, risks and benefits discussed and monitors and equipment checked Spinal Block Patient position: sitting Prep: Betadine and site prepped and draped Patient monitoring: heart rate, continuous pulse ox and blood pressure Approach: midline Location: L3-4 Injection technique: single-shot Needle Needle type: Whitacre  Needle gauge: 24 G Needle length: 10 cm Assessment Sensory level: T6 Additional Notes Expiration date on spinal tray verified. No paresthesias or pain on injection. Tolerated procedure well.

## 2019-04-25 NOTE — Progress Notes (Signed)
Pt c/o of pain 2/3 in left hip. This RN called and requested PO pain medication at this time. Dr. Ronelle Nigh acknowledged. Orders received. See [facility]

## 2019-04-25 NOTE — Evaluation (Signed)
Physical Therapy Evaluation Patient Details Name: Brandon Galvan MRN: [id number] DOB: [birth year] Today's Date: 04/25/2019   History of Present Illness  Pt admitted for R THR.  Clinical Impression  Pt is a pleasant 63 year old male who was admitted for R THR. Pt performs bed mobility, transfers, and ambulation with cga and RW. Pt demonstrates deficits with strength/mobility/pain. Pt is very motivated to perform therapy. Would benefit from skilled PT to address above deficits and promote optimal return to PLOF. Recommend transition to [place] upon discharge from acute hospitalization.     Follow Up Recommendations Home health PT    Equipment Recommendations  Rolling walker with 5" wheels;3in1 (PT)    Recommendations for Other Services       Precautions / Restrictions Precautions Precautions: Fall;Anterior Hip Precaution Booklet Issued: No Restrictions Weight Bearing Restrictions: Yes RLE Weight Bearing: Weight bearing as tolerated      Mobility  Bed Mobility Overal bed mobility: Needs Assistance Bed Mobility: Supine to Sit     Supine to sit: Min guard     General bed mobility comments: safe technique with cues for sequencing. Once seated at EOB, able to sit with upright posture  Transfers Overall transfer level: Needs assistance Equipment used: Rolling walker (2 wheeled) Transfers: Sit to/from Stand Sit to Stand: Min guard         General transfer comment: transfer performed with RW. Cues to push from seated surface. Once standing, upright posture noted  Ambulation/Gait Ambulation/Gait assistance: Min guard Gait Distance (Feet): 15 Feet Assistive device: Rolling walker (2 wheeled) Gait Pattern/deviations: Step-to pattern     General Gait Details: follow commands well. Step to gait pattern performed. Slight unsteadiness noted  Stairs            Wheelchair Mobility    Modified Rankin (Stroke Patients Only)       Balance Overall balance assessment:  Needs assistance Sitting-balance support: Feet supported Sitting balance-Leahy Scale: Good     Standing balance support: Bilateral upper extremity supported Standing balance-Leahy Scale: Good                               Pertinent Vitals/Pain Pain Assessment: 0-10 Pain Score: 2  Pain Location: R hip Pain Descriptors / Indicators: Operative site guarding Pain Intervention(s): Limited activity within patient's tolerance;Premedicated before session;Ice applied    Home Living Family/patient expects to be discharged to:: Private residence Living Arrangements: Spouse/significant other Available Help at Discharge: Family Type of Home: House Home Access: Stairs to enter Entrance Stairs-Rails: Can reach both Entrance Stairs-Number of Steps: 4 Home Layout: Two level;1/2 bath on main level;Bed/bath upstairs Home Equipment: Cane - single point      Prior Function Level of Independence: Independent         Comments: working full time at [employer], standing on concrete floor     Hand Dominance        Extremity/Trunk Assessment   Upper Extremity Assessment Upper Extremity Assessment: Overall WFL for tasks assessed    Lower Extremity Assessment Lower Extremity Assessment: Generalized weakness(R LE grossly 3+/5; L LE grossly 4+/5)       Communication   Communication: No difficulties  Cognition Arousal/Alertness: Awake/alert Behavior During Therapy: WFL for tasks assessed/performed Overall Cognitive Status: Within Functional Limits for tasks assessed  General Comments      Exercises Other Exercises Other Exercises: supine ther-ex performed on R LE including AP, quad sets, glut sets, hip abd/add, and SAQ. All ther-ex performed x 10 reps with cga   Assessment/Plan    PT Assessment Patient needs continued PT services  PT Problem List Decreased strength;Decreased balance;Decreased mobility;Decreased  knowledge of use of DME;Pain       PT Treatment Interventions DME instruction;Gait training;Stair training;Therapeutic exercise    PT Goals (Current goals can be found in the Care Plan section)  Acute Rehab PT Goals Patient Stated Goal: to go home PT Goal Formulation: With patient Time For Goal Achievement: 05/09/19 Potential to Achieve Goals: Good    Frequency BID   Barriers to discharge        Co-evaluation               AM-PAC PT "6 Clicks" Mobility  Outcome Measure Help needed turning from your back to your side while in a flat bed without using bedrails?: A Little Help needed moving from lying on your back to sitting on the side of a flat bed without using bedrails?: A Little Help needed moving to and from a bed to a chair (including a wheelchair)?: A Little Help needed standing up from a chair using your arms (e.g., wheelchair or bedside chair)?: A Little Help needed to walk in hospital room?: A Little Help needed climbing 3-5 steps with a railing? : A Lot 6 Click Score: 17    End of Session Equipment Utilized During Treatment: Gait belt Activity Tolerance: Patient tolerated treatment well Patient left: in chair;with chair alarm set;with SCD's reapplied Nurse Communication: Mobility status PT Visit Diagnosis: Muscle weakness (generalized) (M62.81);Difficulty in walking, not elsewhere classified (R26.2);Pain Pain - Right/Left: Right Pain - part of body: Hip    Time: 1027-2536 PT Time Calculation (min) (ACUTE ONLY): 27 min   Charges:   PT Evaluation $PT Eval Low Complexity: 1 Low PT Treatments $Therapeutic Exercise: 8-22 mins        Greggory Stallion, PT, DPT [phone]   Shakaria Raphael 04/25/2019, 4:21 PM

## 2019-04-25 NOTE — Anesthesia Post-op Follow-up Note (Signed)
Anesthesia QCDR form completed.        

## 2019-04-25 NOTE — Transfer of Care (Signed)
Immediate Anesthesia Transfer of Care Note  Patient: QUINNLAN ABRUZZO  Procedure(s) Performed: TOTAL HIP ARTHROPLASTY ANTERIOR APPROACH (Right Hip)  Patient Location: PACU  Anesthesia Type:Spinal  Level of Consciousness: awake, alert , oriented and patient cooperative  Airway & Oxygen Therapy: Patient Spontanous Breathing  Post-op Assessment: Report given to RN and Post -op Vital signs reviewed and stable  Post vital signs: Reviewed and stable  Last Vitals:  Vitals Value Taken Time  BP 102/62 04/25/19 0857  Temp    Pulse 74 04/25/19 0858  Resp    SpO2 98 % 04/25/19 0858  Vitals shown include unvalidated device data.  Last Pain:  Vitals:   04/25/19 0625  TempSrc: Tympanic  PainSc: 3          Complications: No apparent anesthesia complications

## 2019-04-25 NOTE — TOC Progression Note (Signed)
Transition of Care [facility]) - Progression Note    Patient Details  Name: Brandon Galvan MRN: [id number] Date of Birth: 1955/11/28  Transition of [facility]) CM/SW [place], RN Phone Number: 04/25/2019, 9:43 AM  Clinical Narrative:     Requested the price of Lovenox, will notify the patient once obtained Patient set up with [facility] by Surgeon's office prior to surgery      Expected Discharge Plan and Services                                                 Social Determinants of Health (SDOH) Interventions    Readmission Risk Interventions No flowsheet data found.

## 2019-04-25 NOTE — TOC Benefit Eligibility Note (Signed)
Transition of Care [facility]) Benefit Eligibility Note    Patient Details  Name: Brandon Galvan MRN: [id number] Date of Birth: 1955/11/15   Medication/Dose: Enoxaparin 40mg  once daily for 14 days  Covered?: Yes  Tier: Other(Tier 1)  Prescription Coverage Preferred Pharmacy: [facility]  Spoke with Person/Company/Phone Number:: Caren Griffins with [employer] at [phone]  Co-Pay: Could range from $0 - $20 estimated cost  Prior Approval: No  Deductible: (No deductible on plan.)  Per rep, patient may be eligible for $0 copay for medications.    Dannette Barbara Phone Number: [phone] or [phone] 04/25/2019, 2:51 PM

## 2019-04-26 LAB — CBC
HCT: 39.4 % (ref 39.0–52.0)
Hemoglobin: 13.8 g/dL (ref 13.0–17.0)
MCH: 32.6 pg (ref 26.0–34.0)
MCHC: 35 g/dL (ref 30.0–36.0)
MCV: 93.1 fL (ref 80.0–100.0)
Platelets: 226 10*3/uL (ref 150–400)
RBC: 4.23 MIL/uL (ref 4.22–5.81)
RDW: 12.9 % (ref 11.5–15.5)
WBC: 12.8 10*3/uL — ABNORMAL HIGH (ref 4.0–10.5)
nRBC: 0 % (ref 0.0–0.2)

## 2019-04-26 LAB — BASIC METABOLIC PANEL
Anion gap: 8 (ref 5–15)
BUN: 10 mg/dL (ref 8–23)
CO2: 23 mmol/L (ref 22–32)
Calcium: 8.3 mg/dL — ABNORMAL LOW (ref 8.9–10.3)
Chloride: 101 mmol/L (ref 98–111)
Creatinine, Ser: 0.66 mg/dL (ref 0.61–1.24)
GFR calc Af Amer: >60 mL/min (ref >60)
GFR calc non Af Amer: >60 mL/min (ref >60)
Glucose, Bld: 132 mg/dL — ABNORMAL HIGH (ref 70–99)
Potassium: 3.6 mmol/L (ref 3.5–5.1)
Sodium: 132 mmol/L — ABNORMAL LOW (ref 135–145)

## 2019-04-26 LAB — SURGICAL PATHOLOGY

## 2019-04-26 MED ORDER — HYDROCODONE-ACETAMINOPHEN 5-325 MG PO TABS: 1.0000 | ORAL_TABLET | ORAL | 0 refills | Status: DC | PRN

## 2019-04-26 MED ORDER — OXYCODONE HCL 5 MG PO TABS
5.0000 mg | ORAL_TABLET | ORAL | Status: DC | PRN
  Administered 2019-04-27: 5 mg via ORAL
  Filled 2019-04-26: qty 1

## 2019-04-26 MED ORDER — ENOXAPARIN SODIUM 40 MG/0.4ML ~~LOC~~ SOLN: 40.0000 mg | SUBCUTANEOUS | 0 refills | Status: DC

## 2019-04-26 MED ORDER — TRAMADOL HCL 50 MG PO TABS: 50.0000 mg | ORAL_TABLET | Freq: Four times a day (QID) | ORAL | 1 refills | Status: DC

## 2019-04-26 NOTE — Discharge Instructions (Signed)
ANTERIOR APPROACH TOTAL HIP REPLACEMENT POSTOPERATIVE DIRECTIONS   Hip Rehabilitation, Guidelines Following Surgery  The results of a hip operation are greatly improved after range of motion and muscle strengthening exercises. Follow all safety measures which are given to protect your hip. If any of these exercises cause increased pain or swelling in your joint, decrease the amount until you are comfortable again. Then slowly increase the exercises. Call your caregiver if you have problems or questions.   HOME CARE INSTRUCTIONS  Remove items at home which could result in a fall. This includes throw rugs or furniture in walking pathways.   ICE to the affected hip every three hours for 30 minutes at a time and then as needed for pain and swelling.  Continue to use ice on the hip for pain and swelling from surgery. You may notice swelling that will progress down to the foot and ankle.  This is normal after surgery.  Elevate the leg when you are not up walking on it.    Continue to use the breathing machine which will help keep your temperature down.  It is common for your temperature to cycle up and down following surgery, especially at night when you are not up moving around and exerting yourself.  The breathing machine keeps your lungs expanded and your temperature down.  Do not place pillow under knee, focus on keeping the knee straight while resting  DIET You may resume your previous home diet once your are discharged from the hospital.  DRESSING / WOUND CARE / SHOWERING The wound VAC will stay in place for 5 to 7 days after leaving the hospital.  When the canister begins to be continuously, the wound VAC can be removed.  Physical therapy can help with this.  A new bandage will be applied.  You are able to shower with the wound VAC put on. Keep your dressing dry with showering.  You can keep it covered and pat dry. Change the surgical dressing daily and reapply a dry dressing each  time.  ACTIVITY Walk with your walker as instructed. Use walker as long as suggested by your caregivers. Avoid periods of inactivity such as sitting longer than an hour when not asleep. This helps prevent blood clots.  You may resume a sexual relationship in one month or when given the OK by your doctor.  You may return to work once you are cleared by your doctor.  Do not drive a car for 6 weeks or until released by you surgeon.  Do not drive while taking narcotics.  WEIGHT BEARING Weight bearing as tolerated with assist device (walker, cane, etc) as directed, use it as long as suggested by your surgeon or therapist, typically at least 4-6 weeks.  POSTOPERATIVE CONSTIPATION PROTOCOL Constipation - defined medically as fewer than three stools per week and severe constipation as less than one stool per week.  One of the most common issues patients have following surgery is constipation.  Even if you have a regular bowel pattern at home, your normal regimen is likely to be disrupted due to multiple reasons following surgery.  Combination of anesthesia, postoperative narcotics, change in appetite and fluid intake all can affect your bowels.  In order to avoid complications following surgery, here are some recommendations in order to help you during your recovery period.  Colace (docusate) - Pick up an over-the-counter form of Colace or another stool softener and take twice a day as long as you are requiring postoperative pain medications.  Take with a full glass of water daily.  If you experience loose stools or diarrhea, hold the colace until you stool forms back up.  If your symptoms do not get better within 1 week or if they get worse, check with your doctor.  Dulcolax (bisacodyl) - Pick up over-the-counter and take as directed by the product packaging as needed to assist with the movement of your bowels.  Take with a full glass of water.  Use this product as needed if not relieved by Colace  only.   MiraLax (polyethylene glycol) - Pick up over-the-counter to have on hand.  MiraLax is a solution that will increase the amount of water in your bowels to assist with bowel movements.  Take as directed and can mix with a glass of water, juice, soda, coffee, or tea.  Take if you go more than two days without a movement. Do not use MiraLax more than once per day. Call your doctor if you are still constipated or irregular after using this medication for 7 days in a row.  If you continue to have problems with postoperative constipation, please contact the office for further assistance and recommendations.  If you experience "the worst abdominal pain ever" or develop nausea or vomiting, please contact the office immediatly for further recommendations for treatment.  ITCHING  If you experience itching with your medications, try taking only a single pain pill, or even half a pain pill at a time.  You can also use Benadryl over the counter for itching or also to help with sleep.   TED HOSE STOCKINGS Wear the elastic stockings on both legs for three weeks following surgery during the day but you may remove then at night for sleeping.  MEDICATIONS See your medication summary on the After Visit Summary that the nursing staff will review with you prior to discharge.  You may have some home medications which will be placed on hold until you complete the course of blood thinner medication.  It is important for you to complete the blood thinner medication as prescribed by your surgeon.  Continue your approved medications as instructed at time of discharge.  PRECAUTIONS If you experience chest pain or shortness of breath - call 911 immediately for transfer to the hospital emergency department.  If you develop a fever greater that 101 F, purulent drainage from wound, increased redness or drainage from wound, foul odor from the wound/dressing, or calf pain - CONTACT YOUR SURGEON.                                                    FOLLOW-UP APPOINTMENTS Make sure you keep all of your appointments after your operation with your surgeon and caregivers. You should call the office at the above phone number and make an appointment for approximately two weeks after the date of your surgery or on the date instructed by your surgeon outlined in the "After Visit Summary".  RANGE OF MOTION AND STRENGTHENING EXERCISES  These exercises are designed to help you keep full movement of your hip joint. Follow your caregiver's or physical therapist's instructions. Perform all exercises about fifteen times, three times per day or as directed. Exercise both hips, even if you have had only one joint replacement. These exercises can be done on a training (exercise) mat, on the floor, on a table  or on a bed. Use whatever works the best and is most comfortable for you. Use music or television while you are exercising so that the exercises are a pleasant break in your day. This will make your life better with the exercises acting as a break in routine you can look forward to.  Lying on your back, slowly slide your foot toward your buttocks, raising your knee up off the floor. Then slowly slide your foot back down until your leg is straight again.  Lying on your back spread your legs as far apart as you can without causing discomfort.  Lying on your side, raise your upper leg and foot straight up from the floor as far as is comfortable. Slowly lower the leg and repeat.  Lying on your back, tighten up the muscle in the front of your thigh (quadriceps muscles). You can do this by keeping your leg straight and trying to raise your heel off the floor. This helps strengthen the largest muscle supporting your knee.  Lying on your back, tighten up the muscles of your buttocks both with the legs straight and with the knee bent at a comfortable angle while keeping your heel on the floor.   IF YOU ARE TRANSFERRED TO A SKILLED REHAB  FACILITY If the patient is transferred to a skilled rehab facility following release from the hospital, a list of the current medications will be sent to the facility for the patient to continue.  When discharged from the skilled rehab facility, please have the facility set up the patient's [place] prior to being released. Also, the skilled facility will be responsible for providing the patient with their medications at time of release from the facility to include their pain medication, the muscle relaxants, and their blood thinner medication. If the patient is still at the rehab facility at time of the two week follow up appointment, the skilled rehab facility will also need to assist the patient in arranging follow up appointment in our office and any transportation needs.  MAKE SURE YOU:  Understand these instructions.  Get help right away if you are not doing well or get worse.    Pick up stool softner and laxative for home use following surgery while on pain medications. Do not submerge incision under water. Please use good hand washing techniques while changing dressing each day. May shower starting three days after surgery. Please use a clean towel to pat the incision dry following showers. Continue to use ice for pain and swelling after surgery. Do not use any lotions or creams on the incision until instructed by your surgeon.

## 2019-04-26 NOTE — Progress Notes (Signed)
  Subjective: 1 Day Post-Op Procedure(s) (LRB): TOTAL HIP ARTHROPLASTY ANTERIOR APPROACH (Right) Patient reports pain as moderate.   Patient seen in rounds with Dr. Rudene Christians. Patient is well, and has had no acute complaints or problems Plan is to go Home after hospital stay. Negative for chest pain and shortness of breath Fever: 99.8  Gastrointestinal: Negative for nausea and vomiting  Objective: Vital signs in last 24 hours: Temp:  [97.8 F (36.6 C)-99.8 F (37.7 C)] 99.8 F (37.7 C) (08/07 0440) Pulse Rate:  [58-100] 100 (08/07 0440) Resp:  [12-20] 19 (08/07 0440) BP: (97-140)/(62-95) 128/78 (08/07 0440) SpO2:  [97 %-100 %] 99 % (08/07 0440) Weight:  [87.7 kg] 87.7 kg (08/06 1133)  Intake/Output from previous day:  Intake/Output Summary (Last 24 hours) at 04/26/2019 0556 Last data filed at 04/25/2019 2356 Gross per 24 hour  Intake 1945.46 ml  Output 550 ml  Net 1395.46 ml    Intake/Output this shift: Total I/O In: -  Out: 400 [Urine:400]  Labs: Recent Labs    04/25/19 1124 04/26/19 0350  HGB 14.7 13.8   Recent Labs    04/25/19 1124 04/26/19 0350  WBC 13.5* 12.8*  RBC 4.52 4.23  HCT 42.6 39.4  PLT 221 226   Recent Labs    04/25/19 1124 04/26/19 0350  NA  --  132*  K  --  3.6  CL  --  101  CO2  --  23  BUN  --  10  CREATININE 0.75 0.66  GLUCOSE  --  132*  CALCIUM  --  8.3*   No results for input(s): LABPT, INR in the last 72 hours.   EXAM General - Patient is Alert and Oriented Extremity - Sensation intact distally Dorsiflexion/Plantar flexion intact Compartment soft Dressing/Incision - clean, dry, with the wound VAC in place Motor Function - intact, moving foot and toes well on exam.  Ambulated 15 feet with physical therapy  Past Medical History:  Diagnosis Date  . Arthritis    right hip  . Depression    h/o  . Family history of diabetes mellitus   . History of kidney stones    still has a kidney stone as of 04-16-19   . Hyperlipidemia      Assessment/Plan: 1 Day Post-Op Procedure(s) (LRB): TOTAL HIP ARTHROPLASTY ANTERIOR APPROACH (Right) Active Problems:   Status post total hip replacement, right  Estimated body mass index is 30.28 kg/m as calculated from the following:   Height as of this encounter: 5\' 7"  (1.702 m).   Weight as of this encounter: 87.7 kg. Advance diet Up with therapy D/C IV fluids Discharge home with home health planned for tomorrow or Saturday  DVT Prophylaxis - Lovenox, Foot Pumps and TED hose Weight-Bearing as tolerated to right leg  Reche Dixon, PA-C Orthopaedic Surgery 04/26/2019, 5:56 AM

## 2019-04-26 NOTE — Evaluation (Signed)
Occupational Therapy Evaluation Patient Details Name: Brandon Galvan MRN: [id number] DOB: 26-Dec-1955 Today's Date: 04/26/2019    History of Present Illness 63yo male pt admitted for R THR on 04/25/19.   Clinical Impression   Pt seen for OT evaluation this date, POD#1 from above surgery. Pt was independent in all ADL prior to surgery and working full time prior to surgery. Pt experiencing increasing R hip pain that was impairing his ability to participate in ADL and IADL tasks. Pt is eager to return to PLOF with less pain and improved safety and independence. Pt currently requires Min A for LB ADL tasks without AE, CGA for STS for ADL with AE which he trialed in session with good carryover of learned techniques and mod A for compression stocking mgt. Pt reports spouse can provided needed level of assist. Pt instructed in self care skills, falls prevention strategies, home/routines modifications, DME/AE for LB bathing and dressing tasks, and compression stocking mgt strategies. Handout provided. Pt ambulated to bathroom and performed seated toileting tasks with remote supervision and CGA for ambulation and toilet transfers from regular height toilet. Pt would benefit from [facility] to place overtop standard toilet at home to improve safety and comfort of toilet transfers. Pt would also benefit from additional instruction in self care skills and techniques to help maintain precautions with or without assistive devices to support recall and carryover prior to discharge. Do not anticipate any skilled OT needs following d/c.      Follow Up Recommendations  No OT follow up    Equipment Recommendations  3 in 1 bedside commode    Recommendations for Other Services       Precautions / Restrictions Precautions Precautions: Fall;Anterior Hip Precaution Booklet Issued: No Restrictions Weight Bearing Restrictions: Yes RLE Weight Bearing: Weight bearing as tolerated      Mobility Bed Mobility Overal bed  mobility: Needs Assistance Bed Mobility: Supine to Sit     Supine to sit: Supervision     General bed mobility comments: increased time/effort to perform, heavy BUE reliance  Transfers Overall transfer level: Needs assistance Equipment used: Rolling walker (2 wheeled) Transfers: Sit to/from Stand Sit to Stand: Min guard              Balance Overall balance assessment: Needs assistance Sitting-balance support: Feet supported Sitting balance-Leahy Scale: Good     Standing balance support: Bilateral upper extremity supported Standing balance-Leahy Scale: Good                             ADL either performed or assessed with clinical judgement   ADL                                         General ADL Comments: Min A for LB ADL tasks without AE, CGA for STS for ADL with AE; mod A for compression stocking mgt - pt report spouse can provided level of assist     Vision Patient Visual Report: No change from baseline       Perception     Praxis      Pertinent Vitals/Pain Pain Assessment: 0-10 Pain Score: 5  Pain Location: R hip; 6/10 abdominal cramping Pain Descriptors / Indicators: Cramping;Aching;Sore Pain Intervention(s): Limited activity within patient's tolerance;Monitored during session;Repositioned     Hand Dominance     Extremity/Trunk Assessment Upper  Extremity Assessment Upper Extremity Assessment: Overall WFL for tasks assessed   Lower Extremity Assessment Lower Extremity Assessment: Defer to PT evaluation(expected post-op strength/ROM deficits to RLE)   Cervical / Trunk Assessment Cervical / Trunk Assessment: Other exceptions Cervical / Trunk Exceptions: Pt tends to bend over the RW slightly which pt endorses he had to do before surgery 2/2 R hip pain   Communication Communication Communication: No difficulties   Cognition Arousal/Alertness: Awake/alert Behavior During Therapy: WFL for tasks  assessed/performed Overall Cognitive Status: Within Functional Limits for tasks assessed                                     General Comments       Exercises Other Exercises Other Exercises: Pt instructed in falls prevention strategies including pet care considerations, AE/DME for ADL, home/routines modifications, and compression stocking mgt; handout provided   Shoulder Instructions      Home Living Family/patient expects to be discharged to:: Private residence Living Arrangements: Spouse/significant other Available Help at Discharge: Family Type of Home: House Home Access: Stairs to enter Technical brewer of Steps: 4 Entrance Stairs-Rails: Can reach both Home Layout: Two level;1/2 bath on main level;Bed/bath upstairs Alternate Level Stairs-Number of Steps: flight Alternate Level Stairs-Rails: Left           Home Equipment: Cane - single point          Prior Functioning/Environment Level of Independence: Independent        Comments: working full time at [employer], standing on concrete floor        OT Problem List: Decreased strength;Pain;Decreased range of motion;Impaired balance (sitting and/or standing);Decreased knowledge of use of DME or AE      OT Treatment/Interventions: Self-care/ADL training;Therapeutic exercise;Therapeutic activities;DME and/or AE instruction;Patient/family education;Balance training    OT Goals(Current goals can be found in the care plan section) Acute Rehab OT Goals Patient Stated Goal: to go home OT Goal Formulation: With patient Time For Goal Achievement: 05/10/19 Potential to Achieve Goals: Good ADL Goals Pt Will Perform Lower Body Dressing: with modified independence;sit to/from stand;with adaptive equipment Pt Will Transfer to Toilet: ambulating;with modified independence(BSC over toilet, LRAD for amb) Additional ADL Goal #1: Pt will independently instruct family/caregiver in compression stocking mgt.   OT Frequency: Min 1X/week   Barriers to D/C:            Co-evaluation              AM-PAC OT "6 Clicks" Daily Activity     Outcome Measure Help from another person eating meals?: None Help from another person taking care of personal grooming?: None Help from another person toileting, which includes using toliet, bedpan, or urinal?: A Little Help from another person bathing (including washing, rinsing, drying)?: A Little Help from another person to put on and taking off regular upper body clothing?: None Help from another person to put on and taking off regular lower body clothing?: A Little 6 Click Score: 21   End of Session Equipment Utilized During Treatment: Gait belt;Rolling walker  Activity Tolerance: Patient tolerated treatment well Patient left: in chair;with call bell/phone within reach;with chair alarm set;with SCD's reapplied  OT Visit Diagnosis: Other abnormalities of gait and mobility (R26.89);Pain Pain - Right/Left: Right Pain - part of body: Hip                Time: 2703-5009 OT Time Calculation (min): 39  min Charges:  OT General Charges $OT Visit: 1 Visit OT Evaluation $OT Eval Low Complexity: 1 Low OT Treatments $Self Care/Home Management : 23-37 mins  Jeni Salles, MPH, MS, OTR/L ascom [phone] 04/26/19, 11:07 AM

## 2019-04-26 NOTE — Progress Notes (Signed)
Physical Therapy Treatment Patient Details Name: Brandon Galvan MRN: [id number] DOB: 06-01-1956 Today's Date: 04/26/2019    History of Present Illness 63yo male pt admitted for R THR on 04/25/19.    PT Comments    Pt is making good progress towards goals with improved gait distance this session in addition to improved speed. Good endurance with there-ex, reviewed and practiced entire HEP. Will need to perform stair training prior to dc from hospital. Has 4 steps with B rails to enter and then flight to get to bedroom. Will continue to progress.   Follow Up Recommendations  Home health PT     Equipment Recommendations  Rolling walker with 5" wheels;3in1 (PT)    Recommendations for Other Services       Precautions / Restrictions Precautions Precautions: Fall;Anterior Hip Precaution Booklet Issued: Yes (comment) Restrictions Weight Bearing Restrictions: Yes RLE Weight Bearing: Weight bearing as tolerated    Mobility  Bed Mobility               General bed mobility comments: not performed, received in recliner  Transfers Overall transfer level: Needs assistance Equipment used: Rolling walker (2 wheeled) Transfers: Sit to/from Stand Sit to Stand: Min guard         General transfer comment: cues for pushing from seated surface. Upright posture noted  Ambulation/Gait Ambulation/Gait assistance: Min guard Gait Distance (Feet): 220 Feet Assistive device: Rolling walker (2 wheeled) Gait Pattern/deviations: Step-through pattern Gait velocity: 10' in 8 seconds   General Gait Details: improved gait speed this PM session. Reciprocal gait and improved posture. RW used   Marine scientist Rankin (Stroke Patients Only)       Balance Overall balance assessment: Needs assistance Sitting-balance support: Feet supported Sitting balance-Leahy Scale: Good     Standing balance support: Bilateral upper extremity  supported Standing balance-Leahy Scale: Good                              Cognition Arousal/Alertness: Awake/alert Behavior During Therapy: WFL for tasks assessed/performed Overall Cognitive Status: Within Functional Limits for tasks assessed                                        Exercises Other Exercises Other Exercises: seated ther-ex performed on R LE including AP, LAQ, alt. marching, heel slides, glut sets, hip abd/add, and SLR. All ther-ex performed x 12 reps with cga. Written HEP given and reviewed    General Comments        Pertinent Vitals/Pain Pain Assessment: No/denies pain    Home Living                      Prior Function            PT Goals (current goals can now be found in the care plan section) Acute Rehab PT Goals Patient Stated Goal: to go home PT Goal Formulation: With patient Time For Goal Achievement: 05/09/19 Potential to Achieve Goals: Good Progress towards PT goals: Progressing toward goals    Frequency    BID      PT Plan Current plan remains appropriate    Co-evaluation              AM-PAC PT "6 Clicks"  Mobility   Outcome Measure  Help needed turning from your back to your side while in a flat bed without using bedrails?: A Little Help needed moving from lying on your back to sitting on the side of a flat bed without using bedrails?: A Little Help needed moving to and from a bed to a chair (including a wheelchair)?: A Little Help needed standing up from a chair using your arms (e.g., wheelchair or bedside chair)?: A Little Help needed to walk in hospital room?: A Little Help needed climbing 3-5 steps with a railing? : A Little 6 Click Score: 18    End of Session Equipment Utilized During Treatment: Gait belt Activity Tolerance: Patient tolerated treatment well Patient left: in chair;with chair alarm set;with SCD's reapplied Nurse Communication: Mobility status PT Visit Diagnosis:  Muscle weakness (generalized) (M62.81);Difficulty in walking, not elsewhere classified (R26.2);Pain Pain - Right/Left: Right Pain - part of body: Hip     Time: 1325-1350 PT Time Calculation (min) (ACUTE ONLY): 25 min  Charges:  $Gait Training: 8-22 mins $Therapeutic Exercise: 8-22 mins                     Greggory Stallion, PT, DPT [phone]    Shady Bradish 04/26/2019, 3:11 PM

## 2019-04-26 NOTE — TOC Initial Note (Signed)
Transition of Care [facility]) - Initial/Assessment Note    Patient Details  Name: Brandon Galvan MRN: [id number] Date of Birth: 01-10-1956  [facility] CM/SW Contact:    Su Hilt, RN Phone Number: 04/26/2019, 1:59 PM  Clinical Narrative:                  Met with the patient to discuss DC plan and needs He lives at home with his wife Velva Harman She will provide transportation He needs a RW and a BSC,  The price for Lovenox of 0-20$ has been provided, he states that he can afford his medications He sees dr Jaynie Crumble as PCP and is up to date on visits  Expected Discharge Plan: [place] Barriers to Discharge: Continued Medical Work up   Patient Goals and CMS Choice Patient states their goals for this hospitalization and ongoing recovery are:: go home CMS Medicare.gov Compare Post Acute Care list provided to:: Patient Choice offered to / list presented to : Patient  Expected Discharge Plan and Services Expected Discharge Plan: [place]   Discharge Planning Services: CM Consult Post Acute Care Choice: [place] arrangements for the past 2 months: Single Family Home                 DME Arranged: 3-N-1, Walker rolling DME Agency: [employer] Date DME Agency Contacted: 04/26/19 Time DME Agency Contacted: [phone] Representative spoke with at DME Agency: [place]: PT [place]: [facility] [employer]) Date [place]: 04/26/19 Time [place]: 1359 Representative spoke with at [place]: [place] Arrangements/Services Living arrangements for the past 2 months: [place] Lives with:: Spouse Patient language and need for interpreter reviewed:: No Do you feel safe going back to the place where you live?: Yes      Need for Family Participation in Patient Care: No (Comment) Care giver support system in place?: Yes (comment)   Criminal Activity/Legal  Involvement Pertinent to Current Situation/Hospitalization: No - Comment as needed  Activities of Daily Living Home Assistive Devices/Equipment: None ADL Screening (condition at time of admission) Patient's cognitive ability adequate to safely complete daily activities?: Yes Is the patient deaf or have difficulty hearing?: No Does the patient have difficulty seeing, even when wearing glasses/contacts?: No Does the patient have difficulty concentrating, remembering, or making decisions?: No Patient able to express need for assistance with ADLs?: Yes Does the patient have difficulty dressing or bathing?: No Independently performs ADLs?: Yes (appropriate for developmental age) Does the patient have difficulty walking or climbing stairs?: Yes Weakness of Legs: Left Weakness of Arms/Hands: None  Permission Sought/Granted   Permission granted to share information with : Yes, Verbal Permission Granted              Emotional Assessment Appearance:: Appears stated age Attitude/Demeanor/Rapport: Engaged Affect (typically observed): Accepting, Appropriate, Calm Orientation: : Oriented to Self, Oriented to Place, Oriented to  Time, Oriented to Situation Alcohol / Substance Use: Not Applicable    Admission diagnosis:  PRIMARY OSTEOARTHRITIS OF RIGHT HIP Patient Active Problem List   Diagnosis Date Noted  . Status post total hip replacement, right 04/25/2019  . Hyperlipidemia 01/13/2016  . History of kidney stones 01/13/2016   PCP:  Carmon Ginsberg, PA Pharmacy:   [id number] [id number]: [phone]: [phone]  Social Determinants of Health (SDOH) Interventions    Readmission Risk Interventions No flowsheet data found.

## 2019-04-26 NOTE — Progress Notes (Signed)
Physical Therapy Treatment Patient Details Name: Brandon Galvan MRN: [id number] DOB: Feb 03, 1956 Today's Date: 04/26/2019    History of Present Illness 63yo male pt admitted for R THR on 04/25/19.    PT Comments    Pt is making good progress towards goals with improved gait distance this session. Reports no pain at this time. Good endurance with there-ex. Needs cues for upright posture using RW. Symmetrical step length. Will continue to progress.   Follow Up Recommendations  Home health PT     Equipment Recommendations  Rolling walker with 5" wheels;3in1 (PT)    Recommendations for Other Services       Precautions / Restrictions Precautions Precautions: Fall;Anterior Hip Precaution Booklet Issued: Yes (comment) Restrictions Weight Bearing Restrictions: Yes RLE Weight Bearing: Weight bearing as tolerated    Mobility  Bed Mobility Overal bed mobility: Needs Assistance Bed Mobility: Supine to Sit     Supine to sit: Supervision     General bed mobility comments: not performed, received in recliner  Transfers Overall transfer level: Needs assistance Equipment used: Rolling walker (2 wheeled) Transfers: Sit to/from Stand Sit to Stand: Min guard         General transfer comment: cues for pushing from seated surface. Upright posture noted  Ambulation/Gait Ambulation/Gait assistance: Min guard Gait Distance (Feet): 130 Feet Assistive device: Rolling walker (2 wheeled) Gait Pattern/deviations: Step-through pattern     General Gait Details: able to progress to reciprocal gait pattern. Needs occasional cues for upright posture, tends to hunch over. RW used   Marine scientist Rankin (Stroke Patients Only)       Balance Overall balance assessment: Needs assistance Sitting-balance support: Feet supported Sitting balance-Leahy Scale: Good     Standing balance support: Bilateral upper extremity supported Standing  balance-Leahy Scale: Good                              Cognition Arousal/Alertness: Awake/alert Behavior During Therapy: WFL for tasks assessed/performed Overall Cognitive Status: Within Functional Limits for tasks assessed                                        Exercises Other Exercises Other Exercises: seated ther-ex performed on R LE including AP, quad sets, glut sets, hip abd/add, and SLR. All ther-ex performed x 12 reps with cga. Written HEP given and reviewed Other Exercises: Pt instructed in falls prevention strategies including pet care considerations, AE/DME for ADL, home/routines modifications, and compression stocking mgt; handout provided    General Comments        Pertinent Vitals/Pain Pain Assessment: No/denies pain Pain Score: 5  Pain Location: R hip; 6/10 abdominal cramping Pain Descriptors / Indicators: Cramping;Aching;Sore Pain Intervention(s): Limited activity within patient's tolerance;Monitored during session;Repositioned    Home Living Family/patient expects to be discharged to:: Private residence Living Arrangements: Spouse/significant other Available Help at Discharge: Family Type of Home: House Home Access: Stairs to enter Entrance Stairs-Rails: Can reach both Home Layout: Two level;1/2 bath on main level;Bed/bath upstairs Home Equipment: Cane - single point      Prior Function Level of Independence: Independent      Comments: working full time at [employer], standing on concrete floor   PT Goals (current goals can now be found in  the care plan section) Acute Rehab PT Goals Patient Stated Goal: to go home PT Goal Formulation: With patient Time For Goal Achievement: 05/09/19 Potential to Achieve Goals: Good Progress towards PT goals: Progressing toward goals    Frequency    BID      PT Plan Current plan remains appropriate    Co-evaluation              AM-PAC PT "6 Clicks" Mobility   Outcome  Measure  Help needed turning from your back to your side while in a flat bed without using bedrails?: A Little Help needed moving from lying on your back to sitting on the side of a flat bed without using bedrails?: A Little Help needed moving to and from a bed to a chair (including a wheelchair)?: A Little Help needed standing up from a chair using your arms (e.g., wheelchair or bedside chair)?: A Little Help needed to walk in hospital room?: A Little Help needed climbing 3-5 steps with a railing? : A Lot 6 Click Score: 17    End of Session Equipment Utilized During Treatment: Gait belt Activity Tolerance: Patient tolerated treatment well Patient left: in chair;with chair alarm set;with SCD's reapplied Nurse Communication: Mobility status PT Visit Diagnosis: Muscle weakness (generalized) (M62.81);Difficulty in walking, not elsewhere classified (R26.2);Pain Pain - Right/Left: Right Pain - part of body: Hip     Time: 1000-1023 PT Time Calculation (min) (ACUTE ONLY): 23 min  Charges:  $Gait Training: 8-22 mins $Therapeutic Exercise: 8-22 mins                     Greggory Stallion, PT, DPT [phone]    Anslee Micheletti 04/26/2019, 11:11 AM

## 2019-04-26 NOTE — Anesthesia Postprocedure Evaluation (Signed)
Anesthesia Post Note  Patient: DEANTHONY MAULL  Procedure(s) Performed: TOTAL HIP ARTHROPLASTY ANTERIOR APPROACH (Right Hip)  Patient location during evaluation: Nursing Unit Anesthesia Type: Spinal Level of consciousness: awake and alert and oriented Pain management: pain level controlled Vital Signs Assessment: post-procedure vital signs reviewed and stable Respiratory status: spontaneous breathing, nonlabored ventilation and respiratory function stable Cardiovascular status: stable Postop Assessment: no headache, no backache, no apparent nausea or vomiting, patient able to bend at knees, able to ambulate and adequate PO intake Anesthetic complications: no     Last Vitals:  Vitals:   04/25/19 2344 04/26/19 0440  BP: 119/80 128/78  Pulse: 96 100  Resp: 18 19  Temp: 37.4 C 37.7 C  SpO2: 97% 99%    Last Pain:  Vitals:   04/26/19 0440  TempSrc: Oral  PainSc:                  Lanora Manis

## 2019-04-27 MED ORDER — FLEET ENEMA 7-19 GM/118ML RE ENEM
1.0000 | ENEMA | Freq: Once | RECTAL | Status: AC
  Administered 2019-04-27: 1 via RECTAL

## 2019-04-27 NOTE — Progress Notes (Signed)
  Subjective: 2 Days Post-Op Procedure(s) (LRB): TOTAL HIP ARTHROPLASTY ANTERIOR APPROACH (Right) Patient reports pain as very mild.   Patient seen in rounds with Dr. Rudene Christians. Patient is well, and has had no acute complaints or problems Plan is to go Home after hospital stay. Negative for chest pain and shortness of breath Fever: No fever Gastrointestinal: Negative for nausea and vomiting  Objective: Vital signs in last 24 hours: Temp:  [97.5 F (36.4 C)-99.2 F (37.3 C)] 99.2 F (37.3 C) (08/07 2318) Pulse Rate:  [85-91] 88 (08/07 2318) Resp:  [18] 18 (08/07 2318) BP: (104-118)/(66-78) 118/66 (08/07 2318) SpO2:  [96 %-98 %] 96 % (08/07 2318)  Intake/Output from previous day:  Intake/Output Summary (Last 24 hours) at 04/27/2019 0658 Last data filed at 04/27/2019 0440 Gross per 24 hour  Intake 508.42 ml  Output 1875 ml  Net -1366.58 ml    Intake/Output this shift: Total I/O In: -  Out: 1325 [Urine:1325]  Labs: Recent Labs    04/25/19 1124 04/26/19 0350  HGB 14.7 13.8   Recent Labs    04/25/19 1124 04/26/19 0350  WBC 13.5* 12.8*  RBC 4.52 4.23  HCT 42.6 39.4  PLT 221 226   Recent Labs    04/25/19 1124 04/26/19 0350  NA  --  132*  K  --  3.6  CL  --  101  CO2  --  23  BUN  --  10  CREATININE 0.75 0.66  GLUCOSE  --  132*  CALCIUM  --  8.3*   No results for input(s): LABPT, INR in the last 72 hours.   EXAM General - Patient is Alert and Oriented Extremity - Sensation intact distally Dorsiflexion/Plantar flexion intact Compartment soft Dressing/Incision - clean, dry, with the wound VAC in place Motor Function - intact, moving foot and toes well on exam.  Ambulated 220 feet with physical therapy  Past Medical History:  Diagnosis Date  . Arthritis    right hip  . Depression    h/o  . Family history of diabetes mellitus   . History of kidney stones    still has a kidney stone as of 04-16-19   . Hyperlipidemia     Assessment/Plan: 2 Days Post-Op  Procedure(s) (LRB): TOTAL HIP ARTHROPLASTY ANTERIOR APPROACH (Right) Active Problems:   Status post total hip replacement, right  Estimated body mass index is 30.28 kg/m as calculated from the following:   Height as of this encounter: 5\' 7"  (1.702 m).   Weight as of this encounter: 87.7 kg. Advance diet Up with therapy D/C IV fluids Discharge home with home health planned for today  DVT Prophylaxis - Lovenox, Foot Pumps and TED hose Weight-Bearing as tolerated to right leg  Reche Dixon, PA-C Orthopaedic Surgery 04/27/2019, 6:58 AM

## 2019-04-27 NOTE — Progress Notes (Signed)
Physical Therapy Treatment Patient Details Name: Brandon Galvan MRN: [id number] DOB: 26-Jul-1956 Today's Date: 04/27/2019    History of Present Illness 63yo male pt admitted for R THR on 04/25/19.    PT Comments    Participated in exercises as described below.  Ambulated lap and completed stair training.  No issues or questions regarding discharge.  Awaiting BM and anticipating discharge home today.   Follow Up Recommendations  Home health PT     Equipment Recommendations  Rolling walker with 5" wheels;3in1 (PT)    Recommendations for Other Services       Precautions / Restrictions Precautions Precautions: Fall;Anterior Hip Restrictions Weight Bearing Restrictions: Yes RLE Weight Bearing: Weight bearing as tolerated    Mobility  Bed Mobility Overal bed mobility: Needs Assistance Bed Mobility: Supine to Sit     Supine to sit: Min assist        Transfers Overall transfer level: Needs assistance Equipment used: Rolling walker (2 wheeled) Transfers: Sit to/from Stand Sit to Stand: Supervision;Min guard            Ambulation/Gait Ambulation/Gait assistance: Min guard Gait Distance (Feet): 220 Feet Assistive device: Rolling walker (2 wheeled) Gait Pattern/deviations: Step-to pattern Gait velocity: decreased   General Gait Details: steady with no LOB or buckling.  Good safety   Stairs Stairs: Yes Stairs assistance: Min guard;Supervision Stair Management: Two rails Number of Stairs: 4 General stair comments: with ease   Wheelchair Mobility    Modified Rankin (Stroke Patients Only)       Balance Overall balance assessment: Modified Independent Sitting-balance support: Feet supported Sitting balance-Leahy Scale: Good     Standing balance support: Bilateral upper extremity supported Standing balance-Leahy Scale: Good                              Cognition Arousal/Alertness: Awake/alert Behavior During Therapy: WFL for tasks  assessed/performed Overall Cognitive Status: Within Functional Limits for tasks assessed                                        Exercises Other Exercises Other Exercises: supine ther-ex performed on R LE including AP, LAQ, alt. marching, heel slides, glut sets, hip abd/add, and SLR. All ther-ex performed x 12 reps with cga.    General Comments        Pertinent Vitals/Pain Pain Assessment: No/denies pain    Home Living                      Prior Function            PT Goals (current goals can now be found in the care plan section) Progress towards PT goals: Progressing toward goals    Frequency    BID      PT Plan Current plan remains appropriate    Co-evaluation              AM-PAC PT "6 Clicks" Mobility   Outcome Measure  Help needed turning from your back to your side while in a flat bed without using bedrails?: None Help needed moving from lying on your back to sitting on the side of a flat bed without using bedrails?: A Little Help needed moving to and from a bed to a chair (including a wheelchair)?: None Help needed standing up from a chair using your  arms (e.g., wheelchair or bedside chair)?: None Help needed to walk in hospital room?: A Little Help needed climbing 3-5 steps with a railing? : A Little 6 Click Score: 21    End of Session Equipment Utilized During Treatment: Gait belt Activity Tolerance: Patient tolerated treatment well Patient left: in chair;with chair alarm set;with call bell/phone within reach Nurse Communication: Mobility status Pain - Right/Left: Right Pain - part of body: Hip     Time: 0148-4039 PT Time Calculation (min) (ACUTE ONLY): 16 min  Charges:  $Gait Training: 8-22 mins                     Chesley Noon, PTA 04/27/19, 9:24 AM

## 2019-04-27 NOTE — Discharge Summary (Signed)
Physician Discharge Summary  Subjective: 2 Days Post-Op Procedure(s) (LRB): TOTAL HIP ARTHROPLASTY ANTERIOR APPROACH (Right) Patient reports pain as mild.   Patient seen in rounds with Dr. Rudene Christians. Patient is well, and has had no acute complaints or problems Patient is ready to go home with home health physical therapy  Physician Discharge Summary  Patient ID: Brandon Galvan MRN: [id number] DOB/AGE: 64-May-1957 64 y.o.  Admit date: 04/25/2019 Discharge date: 04/27/2019  Admission Diagnoses:  Discharge Diagnoses:  Active Problems:   Status post total hip replacement, right   Discharged Condition: good  Hospital Course: The patient is postop day 2 from a right anterior approach total hip replacement.  He is doing very well since surgery.  He ambulated 220 feet yesterday.  He is transferring well.  He still has to have a bowel movement before discharge.  His vitals have remained stable.  His labs have remained stable.  Treatments: surgery:  TOTAL HIP ARTHROPLASTY ANTERIOR APPROACH (Right)  SURGEON: Laurene Footman, MD  ASSISTANTS: none  ANESTHESIA:   spinal  EBL:  Total I/O In: 400 [I.V.:400] Out: 150 [Urine:100; Blood:50]  BLOOD ADMINISTERED:none  DRAINS: none   LOCAL MEDICATIONS USED:  MARCAINE    and OTHER Exparel  SPECIMEN:  Source of Specimen:  Right femoral head  DISPOSITION OF SPECIMEN:  PATHOLOGY  COUNTS:  YES  TOURNIQUET:  * No tourniquets in log *  IMPLANTS: Medacta AMIS 4 std stem, 54 Mpact DM cup and liner, S 28 mm ceramic head  Discharge Exam: Blood pressure 118/66, pulse 88, temperature 99.2 F (37.3 C), temperature source Oral, resp. rate 18, height 5\' 7"  (1.702 m), weight 87.7 kg, SpO2 96 %.   Disposition: Discharge disposition: 01-Home or Self Care        Allergies as of 04/27/2019      Reactions   Penicillin G Anaphylaxis, Hives, Swelling   Pt verbalizes swelling to lips and hives on abd Did it involve swelling of the  face/tongue/throat, SOB, or low BP? Yes Did it involve sudden or severe rash/hives, skin peeling, or any reaction on the inside of your mouth or nose? yes Did you need to seek medical attention at a hospital or doctor's office? Yes When did it last happen?2010 If all above answers are "NO", may proceed with cephalosporin use.   Amoxicillin Rash   Benadryl [diphenhydramine] Rash      Medication List    TAKE these medications   cyclobenzaprine 5 MG tablet Commonly known as: FLEXERIL Take 1 tablet (5 mg total) by mouth 2 (two) times daily as needed for muscle spasms.   enoxaparin 40 MG/0.4ML injection Commonly known as: LOVENOX Inject 0.4 mLs (40 mg total) into the skin daily for 14 doses.   HYDROcodone-acetaminophen 5-325 MG tablet Commonly known as: NORCO/VICODIN Take 1-2 tablets by mouth every 4 (four) hours as needed for moderate pain (pain score 4-6).   meloxicam 15 MG tablet Commonly known as: MOBIC TAKE 1 TABLET BY MOUTH DAILY AS NEEDED FOR PAIN What changed:   when to take this  additional instructions   methocarbamol 500 MG tablet Commonly known as: ROBAXIN Take 500 mg by mouth every morning.   sildenafil 50 MG tablet Commonly known as: VIAGRA TAKE 1 TABLET (50 MG TOTAL) BY MOUTH DAILY AS NEEDED FOR ERECTILE DYSFUNCTION.   tamsulosin 0.4 MG Caps capsule Commonly known as: FLOMAX Take 0.4 mg by mouth daily after breakfast.   traMADol 50 MG tablet Commonly known as: ULTRAM Take 1 tablet (50  mg total) by mouth every 6 (six) hours.   triamcinolone cream 0.1 % Commonly known as: KENALOG Apply 1 application topically daily.            Durable Medical Equipment  (From admission, onward)         Start     Ordered   04/25/19 1030  DME Bedside commode  Once    Question:  Patient needs a bedside commode to treat with the following condition  Answer:  Status post total hip replacement, right   04/25/19 1029   04/25/19 1030  DME 3 n 1  Once      04/25/19 1029   04/25/19 1030  DME Walker rolling  Once    Question:  Patient needs a walker to treat with the following condition  Answer:  Status post total hip replacement, right   04/25/19 1029         Follow-up Information    Duanne Guess, PA-C. Go in 2 day(s).   Specialties: Orthopedic Surgery, Emergency Medicine Why: For staple removal Contact information: [place] [address] [phone]           Signed: Prescott Parma, Icarus Partch 04/27/2019, 7:00 AM   Objective: Vital signs in last 24 hours: Temp:  [97.5 F (36.4 C)-99.2 F (37.3 C)] 99.2 F (37.3 C) (08/07 2318) Pulse Rate:  [85-91] 88 (08/07 2318) Resp:  [18] 18 (08/07 2318) BP: (104-118)/(66-78) 118/66 (08/07 2318) SpO2:  [96 %-98 %] 96 % (08/07 2318)  Intake/Output from previous day:  Intake/Output Summary (Last 24 hours) at 04/27/2019 0700 Last data filed at 04/27/2019 0440 Gross per 24 hour  Intake 508.42 ml  Output 1875 ml  Net -1366.58 ml    Intake/Output this shift: Total I/O In: -  Out: 1325 [Urine:1325]  Labs: Recent Labs    04/25/19 1124 04/26/19 0350  HGB 14.7 13.8   Recent Labs    04/25/19 1124 04/26/19 0350  WBC 13.5* 12.8*  RBC 4.52 4.23  HCT 42.6 39.4  PLT 221 226   Recent Labs    04/25/19 1124 04/26/19 0350  NA  --  132*  K  --  3.6  CL  --  101  CO2  --  23  BUN  --  10  CREATININE 0.75 0.66  GLUCOSE  --  132*  CALCIUM  --  8.3*   No results for input(s): LABPT, INR in the last 72 hours.  EXAM: General - Patient is Alert and Oriented Extremity - Neurovascular intact Sensation intact distally Dorsiflexion/Plantar flexion intact Compartment soft Incision - clean, dry, with the wound VAC in place Motor Function -plantarflexion and dorsiflexion are intact  Assessment/Plan: 2 Days Post-Op Procedure(s) (LRB): TOTAL HIP ARTHROPLASTY ANTERIOR APPROACH (Right) Procedure(s) (LRB): TOTAL HIP ARTHROPLASTY  ANTERIOR APPROACH (Right) Past Medical History:  Diagnosis Date  . Arthritis    right hip  . Depression    h/o  . Family history of diabetes mellitus   . History of kidney stones    still has a kidney stone as of 04-16-19   . Hyperlipidemia    Active Problems:   Status post total hip replacement, right  Estimated body mass index is 30.28 kg/m as calculated from the following:   Height as of this encounter: 5\' 7"  (1.702 m).   Weight as of this encounter: 87.7 kg. Up with therapy Discharge home with home health Diet - Regular diet Follow up - in 2  weeks Activity - WBAT Disposition - Home Condition Upon Discharge - Good DVT Prophylaxis - Lovenox and TED hose  Reche Dixon, PA-C Orthopaedic Surgery 04/27/2019, 7:00 AM

## 2019-04-27 NOTE — Progress Notes (Signed)
Occupational Therapy Treatment Patient Details Name: Brandon Galvan MRN: [id number] DOB: 1956-03-17 Today's Date: 04/27/2019    History of present illness 63yo male pt admitted for R THR on 04/25/19.   OT comments  Pt progressing toward stated goals. Focused session on LB ADL equipment instruction with reacher. Pt trialed reacher using pillow case to simulate clothing at mod I level. 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 Educated pt on sock aide as well, he states he has a long handled sponge. Re visited compression sock management for easier use at home, pt in understanding. Anticipate d/c this date, but will continue to follow as acute.   Follow Up Recommendations  No OT follow up    Equipment Recommendations  3 in 1 bedside commode;Other (comment)(LB ADL equipment)    Recommendations for Other Services      Precautions / Restrictions Precautions Precautions: Fall;Anterior Hip Restrictions Weight Bearing Restrictions: Yes RLE Weight Bearing: Weight bearing as tolerated       Mobility Bed Mobility Overal bed mobility: Needs Assistance Bed Mobility: Supine to Sit     Supine to sit: Min assist     General bed mobility comments: up in chair  Transfers Overall transfer level: Needs assistance Equipment used: Rolling walker (2 wheeled) Transfers: Sit to/from Stand Sit to Stand: Supervision;Min guard              Balance Overall balance assessment: Modified Independent Sitting-balance support: Feet supported Sitting balance-Leahy Scale: Good     Standing balance support: Bilateral upper extremity supported Standing balance-Leahy Scale: Good                             ADL either performed or assessed with clinical judgement   ADL Overall ADL's : Needs assistance/impaired                     Lower Body Dressing: Modified independent;With adaptive equipment;Cueing for compensatory  techniques;Sitting/lateral leans;Sit to/from stand Lower Body Dressing Details (indicate cue type and reason): with reacher               General ADL Comments: focused on LB ADL training with AE and compression stocking mgt     Vision Patient Visual Report: No change from baseline     Perception     Praxis      Cognition Arousal/Alertness: Awake/alert Behavior During Therapy: WFL for tasks assessed/performed Overall Cognitive Status: Within Functional Limits for tasks assessed                                          Exercises Other Exercises Other Exercises: supine ther-ex performed on R LE including AP, LAQ, alt. marching, heel slides, glut sets, hip abd/add, and SLR. All ther-ex performed x 12 reps with cga.   Shoulder Instructions       General Comments      Pertinent Vitals/ Pain       Pain Assessment: Faces Faces Pain Scale: Hurts a little bit Pain Location: RLE Pain Descriptors / Indicators: Sore Pain Intervention(s): Monitored during session  Home Living  Prior Functioning/Environment              Frequency  Min 1X/week        Progress Toward Goals  OT Goals(current goals can now be found in the care plan section)  Progress towards OT goals: Progressing toward goals  Acute Rehab OT Goals Patient Stated Goal: to go home OT Goal Formulation: With patient Time For Goal Achievement: 05/10/19 Potential to Achieve Goals: Good  Plan Discharge plan remains appropriate    Co-evaluation                 AM-PAC OT "6 Clicks" Daily Activity     Outcome Measure   Help from another person eating meals?: None Help from another person taking care of personal grooming?: None Help from another person toileting, which includes using toliet, bedpan, or urinal?: A Little Help from another person bathing (including washing, rinsing, drying)?: A Little Help from another  person to put on and taking off regular upper body clothing?: None Help from another person to put on and taking off regular lower body clothing?: A Little 6 Click Score: 21    End of Session Equipment Utilized During Treatment: Gait belt;Rolling walker  OT Visit Diagnosis: Other abnormalities of gait and mobility (R26.89);Pain Pain - Right/Left: Right Pain - part of body: Hip   Activity Tolerance Patient tolerated treatment well   Patient Left in chair;with call bell/phone within reach;with chair alarm set   Nurse Communication          Time: 1015-1030 OT Time Calculation (min): 15 min  Charges: OT General Charges $OT Visit: 1 Visit OT Treatments $Self Care/Home Management : 8-22 mins  Zenovia Jarred, MSOT, OTR/L [place]  Zenovia Jarred 04/27/2019, 12:11 PM

## 2019-04-27 NOTE — Progress Notes (Signed)
Pt and wife educated on discharge instructions. Pt had small hard BM. Educated on constipation interventions for home. Pt and wife verbalized understanding. IV removed. VSS. Pt wheeled to medical mall and assisted into wifes vehicle.

## 2019-04-27 NOTE — TOC Transition Note (Signed)
Transition of Care [facility]) - CM/SW Discharge Note   Patient Details  Name: STANFORD STRAUCH MRN: [id number] Date of Birth: 1956-08-17  Transition of [facility]) CM/SW Contact:  Weston Anna, LCSW Phone Number: 04/27/2019, 10:47 AM   Clinical Narrative:     Patients referral for [facility] completed and they are able to accept. MD placed all orders- no DME needs at this time.   Final next level of care: [place] Barriers to Discharge: Continued Medical Work up   Patient Goals and CMS Choice Patient states their goals for this hospitalization and ongoing recovery are:: go home CMS Medicare.gov Compare Post Acute Care list provided to:: Patient Choice offered to / list presented to : Patient  Discharge Placement                       Discharge Plan and Services   Discharge Planning Services: CM Consult Post Acute Care Choice: Home Health          DME Arranged: 3-N-1, Walker rolling DME Agency: [employer] Date DME Agency Contacted: 04/26/19 Time DME Agency Contacted: [phone] Representative spoke with at DME Agency: [place]: PT [place]: [facility] (formerly [employer]) Date [place]: 04/26/19 Time [place]: [place] Representative spoke with at [place]: [place] ([place]) Interventions     Readmission Risk Interventions No flowsheet data found.

## 2019-05-16 ENCOUNTER — Other Ambulatory Visit
Admission: RE | Admit: 2019-05-16 | Discharge: 2019-05-16 | Disposition: A | Discharge status: Home / Self Care | Payer: Managed Care, Other (non HMO) | Source: Ambulatory Visit | Attending: Cardiovascular Disease | Admitting: Cardiovascular Disease

## 2019-05-16 DIAGNOSIS — R55 Syncope and collapse: Secondary | ICD-10-CM | POA: Insufficient documentation

## 2019-05-16 LAB — FIBRIN DERIVATIVES D-DIMER (ARMC ONLY): Fibrin derivatives D-dimer (ARMC): 873.39 ng/mL (FEU) — ABNORMAL HIGH (ref 0.00–499.00)

## 2019-05-16 LAB — TROPONIN I (HIGH SENSITIVITY): Troponin I (High Sensitivity): 5 ng/L (ref <18)

## 2019-06-12 ENCOUNTER — Ambulatory Visit: Payer: Managed Care, Other (non HMO) | Admitting: Urology

## 2019-06-13 ENCOUNTER — Ambulatory Visit (INDEPENDENT_AMBULATORY_CARE_PROVIDER_SITE_OTHER): Payer: Managed Care, Other (non HMO) | Admitting: Urology

## 2019-06-13 ENCOUNTER — Encounter: Payer: Self-pay | Admitting: Urology

## 2019-06-13 ENCOUNTER — Other Ambulatory Visit: Payer: Self-pay

## 2019-06-13 VITALS — BP 149/89 | HR 63 | Ht 67.0 in | Wt 181.0 lb

## 2019-06-13 DIAGNOSIS — R350 Frequency of micturition: Secondary | ICD-10-CM

## 2019-06-13 DIAGNOSIS — R972 Elevated prostate specific antigen [PSA]: Secondary | ICD-10-CM

## 2019-06-13 LAB — URINALYSIS, COMPLETE
Bilirubin, UA: NEGATIVE
Glucose, UA: NEGATIVE
Ketones, UA: NEGATIVE
Nitrite, UA: NEGATIVE
Protein,UA: NEGATIVE
RBC, UA: NEGATIVE
Specific Gravity, UA: 1.015 (ref 1.005–1.030)
Urobilinogen, Ur: 0.2 mg/dL (ref 0.2–1.0)
pH, UA: 6.5 (ref 5.0–7.5)

## 2019-06-13 LAB — MICROSCOPIC EXAMINATION
Bacteria, UA: NONE SEEN
RBC: NONE SEEN /hpf (ref 0–2)

## 2019-06-13 LAB — BLADDER SCAN AMB NON-IMAGING

## 2019-06-13 NOTE — Patient Instructions (Signed)
Prostate Cancer Screening  The prostate is a walnut-sized gland that is located below the bladder and in front of the rectum in males. The function of the prostate (prostate gland) is to add fluid to semen during ejaculation. Prostate cancer is the second most common type of cancer in men. A screening test for cancer is a test that is done before cancer symptoms start. Screening can help to identify cancer at an early stage, when the cancer can be treated more easily. The recommended prostate cancer screening test is a blood test called the prostate-specific antigen (PSA) test. PSA is a protein that is made in the prostate. As you age, your prostate naturally produces more PSA. Abnormally high PSA levels may be caused by:  Prostate cancer.  An enlarged prostate that is not caused by cancer (benign prostatic hyperplasia, BPH). This condition is very common in older men.  A prostate gland infection (prostatitis).  Medicines to assist with hair growth, such as finasteride. Depending on the PSA results, you may need more tests, such as:  A physical exam to check the size of your prostate gland.  Blood and imaging tests.  A procedure to remove tissue samples from your prostate gland for testing (biopsy). Who should have screening? Screening recommendations vary based on age.  If you are younger than age 44, screening is not recommended.  If you are age 71-54 and you have no risk factors, screening is not recommended.  If you are younger than age 30, ask your health care provider if you need screening if you have one of these risk factors: ? Being of African-American descent. ? Having a family history of prostate cancer.  If you are age 65-69, talk with your health care provider about your need for screening and how often screening should be done.  If you are older than age 43, screening is not recommended. This is because the risks that screening can cause are greater than the benefits  that it may provide (risks outweigh the benefits). If you are at high risk for prostate cancer, your health care provider may recommend that you have screenings more often or start screening at a younger age. You may be at high risk if you:  Are older than age 48.  Are African-American.  Have a father, brother, or uncle who has been diagnosed with prostate cancer. The risk may be higher if your family member's cancer occurred at an early age. What are the benefits of screening? There is a small chance that screening may lower your risk of dying from prostate cancer. The chance is small because prostate cancer is typically a slow-growing cancer, and most men with prostate cancer die from a different cause. What are the risks of screening? The main risk of prostate cancer screening is diagnosing and treating prostate cancer that would never have caused any symptoms or problems (overdiagnosis and overtreatment). PSA screening cannot tell you if your PSA is high due to cancer or a different cause. A prostate biopsy is the only procedure to diagnose prostate cancer. Even the results of a biopsy may not tell you if your cancer needs to be treated. Slow-growing prostate cancer may not need any treatment other than monitoring, so diagnosing and treating it may cause unnecessary stress or other side effects. A prostate biopsy may also cause:  Infection or fever.  A false negative. This is a result that shows that you do not have prostate cancer when you actually do have prostate cancer. Questions  to ask your health care provider  When should I start prostate cancer screening?  What is my risk for prostate cancer?  How often do I need screening?  What type of screening tests do I need?  How do I get my test results?  What do my results mean?  Do I need treatment? Contact a health care provider if:  You have difficulty urinating.  You have pain when you urinate or ejaculate.  You have  blood in your urine or semen.  You have pain in your back or in the area of your prostate.  You have trouble getting or maintaining an erection (erectile dysfunction, ED). Summary  Prostate cancer is a common type of cancer in men. The prostate (prostate gland) is located below the bladder and in front of the rectum. This gland adds fluid to semen during ejaculation.  Prostate cancer screening may identify cancer at an early stage, when the cancer can be treated more easily.  The prostate-specific antigen (PSA) test is the recommended screening test for prostate cancer.  Discuss the risks and benefits of prostate cancer screening with your health care provider. If you are age 30 or older, screening is likely to lead to more risks than benefits (risks outweigh the benefits). This information is not intended to replace advice given to you by your health care provider. Make sure you discuss any questions you have with your health care provider. Document Released: 06/16/2017 Document Revised: 08/18/2017 Document Reviewed: 06/16/2017 Elsevier Patient Education  [employer].  Tadalafil tablets (Cialis) What is this medicine? TADALAFIL (tah DA la fil) is used to treat erection problems in men. It is also used for enlargement of the prostate gland in men, a condition called benign prostatic hyperplasia or BPH. This medicine improves urine flow and reduces BPH symptoms. This medicine can also treat both erection problems and BPH when they occur together. This medicine may be used for other purposes; ask your health care provider or pharmacist if you have questions. COMMON BRAND NAME(S): Kathaleen Bury, Cialis What should I tell my health care provider before I take this medicine? They need to know if you have any of these conditions:  bleeding disorders  eye or vision problems, including a rare inherited eye disease called retinitis pigmentosa  anatomical deformation of the penis,  Peyronie's disease, or history of priapism (painful and prolonged erection)  heart disease, angina, a history of heart attack, irregular heart beats, or other heart problems  high or low blood pressure  history of blood diseases, like sickle cell anemia or leukemia  history of stomach bleeding  kidney disease  liver disease  stroke  an unusual or allergic reaction to tadalafil, other medicines, foods, dyes, or preservatives  pregnant or trying to get pregnant  breast-feeding How should I use this medicine? Take this medicine by mouth with a glass of water. Follow the directions on the prescription label. You may take this medicine with or without meals. When this medicine is used for erection problems, your doctor may prescribe it to be taken once daily or as needed. If you are taking the medicine as needed, you may be able to have sexual activity 30 minutes after taking it and for up to 36 hours after taking it. Whether you are taking the medicine as needed or once daily, you should not take more than one dose per day. If you are taking this medicine for symptoms of benign prostatic hyperplasia (BPH) or to treat both BPH  and an erection problem, take the dose once daily at about the same time each day. Do not take your medicine more often than directed. Talk to your pediatrician regarding the use of this medicine in children. Special care may be needed. Overdosage: If you think you have taken too much of this medicine contact a poison control center or emergency room at once. NOTE: This medicine is only for you. Do not share this medicine with others. What if I miss a dose? If you are taking this medicine as needed for erection problems, this does not apply. If you miss a dose while taking this medicine once daily for an erection problem, benign prostatic hyperplasia, or both, take it as soon as you remember, but do not take more than one dose per day. What may interact with this  medicine? Do not take this medicine with any of the following medications:  nitrates like amyl nitrite, isosorbide dinitrate, isosorbide mononitrate, nitroglycerin  other medicines for erectile dysfunction like avanafil, sildenafil, vardenafil  other tadalafil products (Adcirca)  riociguat This medicine may also interact with the following medications:  certain drugs for high blood pressure  certain drugs for the treatment of HIV infection or AIDS  certain drugs used for fungal or yeast infections, like fluconazole, itraconazole, ketoconazole, and voriconazole  certain drugs used for seizures like carbamazepine, phenytoin, and phenobarbital  grapefruit juice  macrolide antibiotics like clarithromycin, erythromycin, troleandomycin  medicines for prostate problems  rifabutin, rifampin or rifapentine This list may not describe all possible interactions. Give your health care provider a list of all the medicines, herbs, non-prescription drugs, or dietary supplements you use. Also tell them if you smoke, drink alcohol, or use illegal drugs. Some items may interact with your medicine. What should I watch for while using this medicine? If you notice any changes in your vision while taking this drug, call your doctor or health care professional as soon as possible. Stop using this medicine and call your health care provider right away if you have a loss of sight in one or both eyes. Contact your doctor or health care professional right away if the erection lasts longer than 4 hours or if it becomes painful. This may be a sign of serious problem and must be treated right away to prevent permanent damage. If you experience symptoms of nausea, dizziness, chest pain or arm pain upon initiation of sexual activity after taking this medicine, you should refrain from further activity and call your doctor or health care professional as soon as possible. Do not drink alcohol to excess (examples, 5  glasses of wine or 5 shots of whiskey) when taking this medicine. When taken in excess, alcohol can increase your chances of getting a headache or getting dizzy, increasing your heart rate or lowering your blood pressure. Using this medicine does not protect you or your partner against HIV infection (the virus that causes AIDS) or other sexually transmitted diseases. What side effects may I notice from receiving this medicine? Side effects that you should report to your doctor or health care professional as soon as possible:  allergic reactions like skin rash, itching or hives, swelling of the face, lips, or tongue  breathing problems  changes in hearing  changes in vision  chest pain  fast, irregular heartbeat  prolonged or painful erection  seizures Side effects that usually do not require medical attention (report to your doctor or health care professional if they continue or are bothersome):  back pain  dizziness  flushing  headache  indigestion  muscle aches  nausea  stuffy or runny nose This list may not describe all possible side effects. Call your doctor for medical advice about side effects. You may report side effects to FDA at 1-800-FDA-1088. Where should I keep my medicine? Keep out of the reach of children. Store at room temperature between 15 and 30 degrees C (59 and 86 degrees F). Throw away any unused medicine after the expiration date. NOTE: This sheet is a summary. It may not cover all possible information. If you have questions about this medicine, talk to your doctor, pharmacist, or health care provider.  2020 Elsevier/Gold Standard (2014-01-24 13:15:49)

## 2019-06-13 NOTE — Progress Notes (Signed)
06/13/19 1:07 PM   Crosby Oyster 1956/09/08 [id number]  Referring provider: Rusty Aus, MD [place],  [place] [address]  CC: Elevated PSA  HPI: I saw [name] in urology clinic in consultation for elevated PSA from Dr. Sabra Heck.  He is a healthy 63 year old male with no family history of prostate cancer who was found to have mildly elevated PSA to 4.27 in June 2020, and on repeat 4.25 in August 2020.  He was reportedly previously followed by Dr. Jacqlyn Larsen, but these notes are unavailable to me.  Baseline PSA was 1.9 in March 2018.  He has a history of E. coli UTI in December 2019 and April 2020 that was treated with antibiotics.  He denies any significant urinary symptoms at baseline and reports he voids with a strong stream.  He was started on Flomax by his PCP in spring 2020, but discontinued this medication after a syncopal episode that was attributed to that medication.  He denies any gross hematuria or flank pain.  He recently underwent hip replacement.  There are no aggravating or alleviating factors.  Severity is mild.  He takes sildenafil 50 mg for ED with moderate improvement.  He is interested in potentially trying Cialis in the future.  There is no cross-sectional imaging to review, however renal ultrasound in May 2020 showed no hydronephrosis, and a possible 5 mm left lower pole nonobstructive stone.  IPSS score in clinic today is 8, with quality of life mostly satisfied.  PVR is 70 mL.   PMH: Past Medical History:  Diagnosis Date  . Arthritis    right hip  . Depression    h/o  . Family history of diabetes mellitus   . History of kidney stones    still has a kidney stone as of 04-16-19   . Hyperlipidemia     Surgical History: Past Surgical History:  Procedure Laterality Date  . COLONOSCOPY WITH PROPOFOL N/A 06/15/2017   Procedure: COLONOSCOPY WITH PROPOFOL;  Surgeon: Jonathon Bellows, MD;  Location: [facility] ENDOSCOPY;   Service: Gastroenterology;  Laterality: N/A;  . LITHOTRIPSY    . TOTAL HIP ARTHROPLASTY Right 04/25/2019   Procedure: TOTAL HIP ARTHROPLASTY ANTERIOR APPROACH;  Surgeon: Hessie Knows, MD;  Location: [facility] ORS;  Service: Orthopedics;  Laterality: Right;    Allergies:  Allergies  Allergen Reactions  . Penicillin G Anaphylaxis, Hives and Swelling    Pt verbalizes swelling to lips and hives on abd Did it involve swelling of the face/tongue/throat, SOB, or low BP? Yes Did it involve sudden or severe rash/hives, skin peeling, or any reaction on the inside of your mouth or nose? yes Did you need to seek medical attention at a hospital or doctor's office? Yes When did it last happen?2010 If all above answers are "NO", may proceed with cephalosporin use.   Marland Kitchen Amoxicillin Rash  . Benadryl [Diphenhydramine] Rash    Family History: Family History  Problem Relation Age of Onset  . Osteoporosis Mother   . Arthritis Mother   . Dementia Mother   . Hypertension Father   . Diabetes Father   . Diabetes Sister   . Hypertension Sister   . Healthy Daughter   . Healthy Son   . Healthy Daughter   . Healthy Daughter     Social History:  reports that he quit smoking about 10 years ago. His smoking use included cigars. He has never used smokeless tobacco. He reports current alcohol use. He reports  that he does not use drugs.  ROS: Please see flowsheet from today's date for complete review of systems.  Physical Exam: BP (!) 149/89   Pulse 63   Ht 5\' 7"  (1.702 m)   Wt 181 lb (82.1 kg)   BMI 28.35 kg/m    Constitutional:  Alert and oriented, No acute distress. Cardiovascular: No clubbing, cyanosis, or edema. Respiratory: Normal respiratory effort, no increased work of breathing. GI: Abdomen is soft, nontender, nondistended, no abdominal masses GU: No CVA tenderness, phallus without lesions, widely patent meatus DRE: 50 g, smooth, no nodules Lymph: No cervical or inguinal lymphadenopathy.  Skin: No rashes, bruises or suspicious lesions. Neurologic: Grossly intact, no focal deficits, moving all 4 extremities. Psychiatric: Normal mood and affect.   Assessment & Plan:   In summary, the patient is a healthy 63 year old male with 1 UTI in the last year, and 2 mildly elevated PSAs this summer of 4.25.  We reviewed the implications of an elevated PSA and the uncertainty surrounding it. In general, a man's PSA increases with age and is produced by both normal and cancerous prostate tissue. The differential diagnosis for elevated PSA includes BPH, prostate cancer, infection, recent intercourse/ejaculation, recent urethroscopic manipulation (foley placement/cystoscopy) or trauma, and prostatitis.   Management of an elevated PSA can include observation or prostate biopsy and we discussed this in detail. Our goal is to detect clinically significant prostate cancers, and manage with either active surveillance, surgery, or radiation for localized disease. Risks of prostate biopsy include bleeding, infection (including life threatening sepsis), pain, and lower urinary symptoms. Hematuria, hematospermia, and blood in the stool are all common after biopsy and can persist up to 4 weeks.   We discussed options including repeat PSA, ultrasound-guided prostate biopsy, or prostate MRI.  Prostate MRI, will call with results Consider trial of 20 mg Cialis in the future for ED   [name], [place] [address], [place], [place] [address] [phone]

## 2019-06-19 ENCOUNTER — Ambulatory Visit: Payer: Managed Care, Other (non HMO) | Admitting: Urology

## 2019-07-09 ENCOUNTER — Other Ambulatory Visit: Payer: Self-pay | Admitting: Urology

## 2019-07-09 DIAGNOSIS — R972 Elevated prostate specific antigen [PSA]: Secondary | ICD-10-CM

## 2019-07-14 ENCOUNTER — Ambulatory Visit: Payer: Managed Care, Other (non HMO)

## 2019-07-30 ENCOUNTER — Telehealth: Payer: Self-pay | Admitting: Urology

## 2019-07-30 NOTE — Telephone Encounter (Signed)
Pt called and states that he has tried to schedule his MRI Prostate with [facility] and [facility] and neither facility can do his MRI due to his hip replacement. He would like a call back to discuss next steps.

## 2019-07-31 NOTE — Telephone Encounter (Signed)
Unable to reach this patient x2 to discuss.  Please set up a virtual phone visit anytime in the next week so we can connect and discuss options, thanks  Nickolas Madrid, MD 07/31/2019

## 2019-08-12 ENCOUNTER — Telehealth (INDEPENDENT_AMBULATORY_CARE_PROVIDER_SITE_OTHER): Payer: Managed Care, Other (non HMO) | Admitting: Urology

## 2019-08-12 ENCOUNTER — Telehealth: Payer: Self-pay | Admitting: Urology

## 2019-08-12 ENCOUNTER — Other Ambulatory Visit: Payer: Self-pay

## 2019-08-12 DIAGNOSIS — R972 Elevated prostate specific antigen [PSA]: Secondary | ICD-10-CM | POA: Diagnosis not present

## 2019-08-12 MED ORDER — TADALAFIL 20 MG PO TABS: 20.0000 mg | ORAL_TABLET | Freq: Every day | ORAL | 11 refills | Status: DC | PRN

## 2019-08-12 NOTE — Progress Notes (Signed)
Virtual Visit via Telephone Note  I connected with Brandon Galvan on 08/12/19 at  3:30 PM EST by telephone and verified that I am speaking with the correct person using two identifiers.   I discussed the limitations, risks, security and privacy concerns of performing an evaluation and management service by telephone and the availability of in person appointments. We discussed the impact of the COVID-19 pandemic on the healthcare system, and the importance of social distancing and reducing patient and provider exposure. I also discussed with the patient that there may be a patient responsible charge related to this service. The patient expressed understanding and agreed to proceed.  Reason for visit: Elevated PSA  History of Present Illness: I had phone follow-up with [name] today to discuss his elevated PSA.  Briefly, he is a healthy 63 year old male with no family history of prostate cancer who was found to have 2 mildly elevated PSA values this summer, 4.27 in June 2020, and 4.25 in August 2020.  He also has a history of 2 UTIs in December 2019 and April 2020.  We had originally planned for a prostate MRI, however his recent artificial hip is not MRI compatible in [place] or [place].   We discussed options at length including repeat PSA with free/total ratio, or pursuing more definitive approach with a transrectal ultrasound-guided prostate biopsy.  We discussed the risks and benefits at length.  We reviewed the implications of an elevated PSA and the uncertainty surrounding it. In general, a man's PSA increases with age and is produced by both normal and cancerous prostate tissue. The differential diagnosis for elevated PSA includes BPH, prostate cancer, infection, recent intercourse/ejaculation, recent urethroscopic manipulation (foley placement/cystoscopy) or trauma, and prostatitis.   Management of an elevated PSA can include observation or prostate biopsy and we discussed  this in detail. Our goal is to detect clinically significant prostate cancers, and manage with either active surveillance, surgery, or radiation for localized disease. Risks of prostate biopsy include bleeding, infection (including life threatening sepsis), pain, and lower urinary symptoms. Hematuria, hematospermia, and blood in the stool are all common after biopsy and can persist up to 4 weeks.    Follow-up: Repeat PSA in 1 to 2 weeks, call with results.  Pursue biopsy if remains elevated Trial of Cialis 20 mg on demand for ED     I discussed the assessment and treatment plan with the patient. The patient was provided an opportunity to ask questions and all were answered. The patient agreed with the plan and demonstrated an understanding of the instructions.   The patient was advised to call back or seek an in-person evaluation if the symptoms worsen or if the condition fails to improve as anticipated.  I provided 15 minutes of non-face-to-face time during this encounter.   [name], MD

## 2019-08-12 NOTE — Telephone Encounter (Signed)
-----   Message from [name], MD sent at 08/12/2019  3:58 PM EST ----- Regarding: lab visit Please set him up for a lab visit in the next 1-2 weeks to draw PSA, [facility]  Nickolas Madrid, MD 08/12/2019

## 2019-08-12 NOTE — Telephone Encounter (Signed)
done

## 2019-08-22 ENCOUNTER — Other Ambulatory Visit: Payer: Managed Care, Other (non HMO)

## 2019-08-22 ENCOUNTER — Other Ambulatory Visit: Payer: Self-pay

## 2019-08-22 DIAGNOSIS — R972 Elevated prostate specific antigen [PSA]: Secondary | ICD-10-CM

## 2019-08-23 ENCOUNTER — Telehealth: Payer: Self-pay

## 2019-08-23 DIAGNOSIS — R972 Elevated prostate specific antigen [PSA]: Secondary | ICD-10-CM

## 2019-08-23 LAB — PSA TOTAL (REFLEX TO FREE): Prostate Specific Ag, Serum: 2.9 ng/mL (ref 0.0–4.0)

## 2019-08-23 NOTE — Telephone Encounter (Signed)
-----   Message from [name], MD sent at 08/23/2019  7:25 AM EST ----- Great news, PSA back down to normal at 2.9 from just above 4. No need for prostate biopsy.   Please schedule follow up in one year with PSA reflex to free prior, thanks  Nickolas Madrid, MD 08/23/2019

## 2019-08-23 NOTE — Telephone Encounter (Signed)
Left pt a mess to call

## 2019-08-23 NOTE — Telephone Encounter (Signed)
Pt returned your call.  

## 2019-08-26 NOTE — Telephone Encounter (Signed)
Patient notified and scheduled, order placed °

## 2020-01-02 DIAGNOSIS — R972 Elevated prostate specific antigen [PSA]: Secondary | ICD-10-CM | POA: Insufficient documentation

## 2020-01-20 DIAGNOSIS — L111 Transient acantholytic dermatosis [Grover]: Secondary | ICD-10-CM | POA: Insufficient documentation

## 2020-08-10 ENCOUNTER — Other Ambulatory Visit: Payer: Self-pay | Admitting: Family Medicine

## 2020-08-27 ENCOUNTER — Other Ambulatory Visit: Payer: Self-pay

## 2020-08-27 ENCOUNTER — Other Ambulatory Visit: Payer: Managed Care, Other (non HMO)

## 2020-08-27 DIAGNOSIS — R972 Elevated prostate specific antigen [PSA]: Secondary | ICD-10-CM

## 2020-08-28 LAB — PSA: Prostate Specific Ag, Serum: 3.4 ng/mL (ref 0.0–4.0)

## 2020-09-02 ENCOUNTER — Ambulatory Visit: Payer: Self-pay | Admitting: Urology

## 2020-09-09 ENCOUNTER — Ambulatory Visit: Payer: Managed Care, Other (non HMO) | Admitting: Urology

## 2020-09-09 ENCOUNTER — Encounter: Payer: Self-pay | Admitting: Urology

## 2020-09-09 ENCOUNTER — Other Ambulatory Visit: Payer: Self-pay

## 2020-09-09 VITALS — BP 129/76 | HR 65 | Ht 67.0 in | Wt 187.0 lb

## 2020-09-09 DIAGNOSIS — Z125 Encounter for screening for malignant neoplasm of prostate: Secondary | ICD-10-CM

## 2020-09-09 DIAGNOSIS — N529 Male erectile dysfunction, unspecified: Secondary | ICD-10-CM

## 2020-09-09 DIAGNOSIS — R972 Elevated prostate specific antigen [PSA]: Secondary | ICD-10-CM

## 2020-09-09 MED ORDER — TADALAFIL 10 MG PO TABS: 10.0000 mg | ORAL_TABLET | Freq: Every day | ORAL | 11 refills | Status: DC | PRN

## 2020-09-09 NOTE — Patient Instructions (Signed)
Prostate Cancer Screening  Prostate cancer screening is a test that is done to check for the presence of prostate cancer in men. The prostate gland is a walnut-sized gland that is located below the bladder and in front of the rectum in males. The function of the prostate is to add fluid to semen during ejaculation. Prostate cancer is the second most common type of cancer in men. Who should have prostate cancer screening?  Screening recommendations vary based on age and other risk factors. Screening is recommended if:  You are older than age 55. If you are age 55-69, talk with your health care provider about your need for screening and how often screening should be done. Because most prostate cancers are slow growing and will not cause death, screening is generally reserved in this age group for men who have a 10-15-year life expectancy.  You are younger than age 55, and you have these risk factors: ? Being a black male or a male of African descent. ? Having a father, brother, or uncle who has been diagnosed with prostate cancer. The risk is higher if your family member's cancer occurred at an early age. Screening is not recommended if:  You are younger than age 40.  You are between the ages of 40 and 54 and you have no risk factors.  You are 70 years of age or older. At this age, the risks that screening can cause are greater than the benefits that it may provide. If you are at high risk for prostate cancer, your health care provider may recommend that you have screenings more often or that you start screening at a younger age. How is screening for prostate cancer done? The recommended prostate cancer screening test is a blood test called the prostate-specific antigen (PSA) test. PSA is a protein that is made in the prostate. As you age, your prostate naturally produces more PSA. Abnormally high PSA levels may be caused by:  Prostate cancer.  An enlarged prostate that is not caused by cancer  (benign prostatic hyperplasia, BPH). This condition is very common in older men.  A prostate gland infection (prostatitis). Depending on the PSA results, you may need more tests, such as:  A physical exam to check the size of your prostate gland.  Blood and imaging tests.  A procedure to remove tissue samples from your prostate gland for testing (biopsy). What are the benefits of prostate cancer screening?  Screening can help to identify cancer at an early stage, before symptoms start and when the cancer can be treated more easily.  There is a small chance that screening may lower your risk of dying from prostate cancer. The chance is small because prostate cancer is a slow-growing cancer, and most men with prostate cancer die from a different cause. What are the risks of prostate cancer screening? The main risk of prostate cancer screening is diagnosing and treating prostate cancer that would never have caused any symptoms or problems. This is called overdiagnosisand overtreatment. PSA screening cannot tell you if your PSA is high due to cancer or a different cause. A prostate biopsy is the only procedure to diagnose prostate cancer. Even the results of a biopsy may not tell you if your cancer needs to be treated. Slow-growing prostate cancer may not need any treatment other than monitoring, so diagnosing and treating it may cause unnecessary stress or other side effects. A prostate biopsy may also cause:  Infection or fever.  A false negative. This is   a result that shows that you do not have prostate cancer when you actually do have prostate cancer. Questions to ask your health care provider  When should I start prostate cancer screening?  What is my risk for prostate cancer?  How often do I need screening?  What type of screening tests do I need?  How do I get my test results?  What do my results mean?  Do I need treatment? Where to find more information  The American Cancer  Society: www.cancer.org  American Urological Association: www.auanet.org Contact a health care provider if:  You have difficulty urinating.  You have pain when you urinate or ejaculate.  You have blood in your urine or semen.  You have pain in your back or in the area of your prostate. Summary  Prostate cancer is a common type of cancer in men. The prostate gland is located below the bladder and in front of the rectum. This gland adds fluid to semen during ejaculation.  Prostate cancer screening may identify cancer at an early stage, when the cancer can be treated more easily.  The prostate-specific antigen (PSA) test is the recommended screening test for prostate cancer.  Discuss the risks and benefits of prostate cancer screening with your health care provider. If you are age 70 or older, the risks that screening can cause are greater than the benefits that it may provide. This information is not intended to replace advice given to you by your health care provider. Make sure you discuss any questions you have with your health care provider. Document Revised: 04/18/2019 Document Reviewed: 04/18/2019 Elsevier Patient Education  2020 Elsevier Inc.  

## 2020-09-09 NOTE — Progress Notes (Signed)
° °  09/09/2020 4:54 PM   Brandon Galvan Apr 05, 1956 [id number]  Reason for visit: Follow up PSA screening, ED  HPI: I saw [name] in urology clinic for the above issues.  He is a healthy 64 year old male with no family history of prostate cancer who has a history of 2 mildly elevated PSAs of 4.27 and 4.25 in June and August 2020, as well as 2 UTIs around that time.  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  He opted for repeat PSA in December 2020 which dropped back down to normal at 2.9, and he opted for yearly screening.  DRE has been benign.  PSA this year is stable at 3.4 from 2.9 last year.  DRE today 40 g, smooth, no nodules or masses.  Regarding his ED, he previously was on sildenafil 50 mg with improvement in his symptoms.  At our last visit we had discussed Cialis as an alternative with a longer half-life, but he never started this medication.  He is interested in trying Cialis this year.  We reviewed the risks and benefits at length.  Trial of Cialis 10 to 20 mg on demand or every other day for ED RTC 1 year for ongoing PSA screening  [name], [place] [address] [place], [place] [address] [phone]

## 2020-09-11 IMAGING — XA OPERATIVE RIGHT HIP WITH PELVIS
2 series · 2 of 2 positions shown · non-contrast
Comparison: None.

CLINICAL DATA: Right hip replacement.

FLUOROSCOPY TIME:  12 seconds.
EXAM:
OPERATIVE RIGHT HIP (WITH PELVIS IF PERFORMED) 2 VIEWS
TECHNIQUE: Fluoroscopic spot image(s) were submitted for interpretation
post-operatively.

[Series 2: ortho standard · 1 of 1 slices shown (1 of 2)]
[im 1/1]
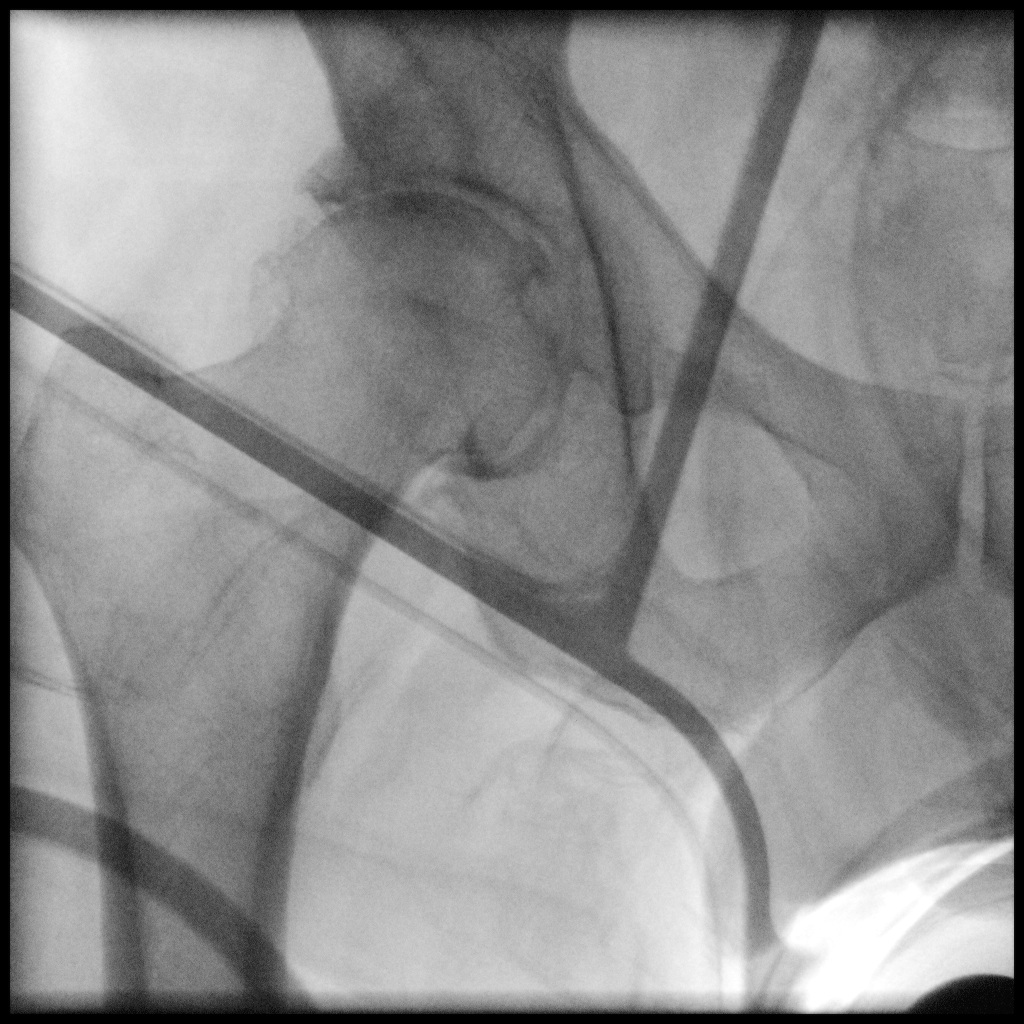

[Series 6: ortho standard · 1 of 1 slices shown (2 of 2)]
[im 1/1]
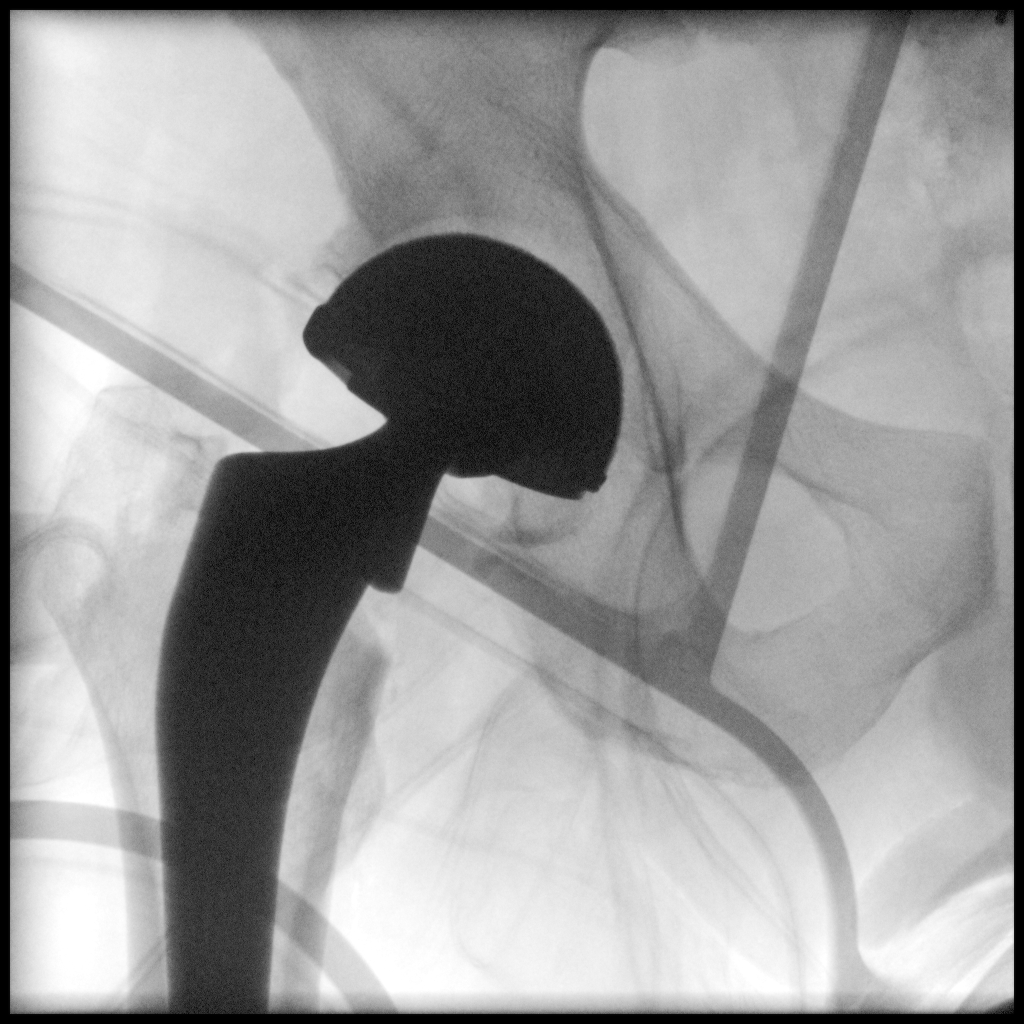

[2 of 2 positions shown; findings below may reference images not displayed]

FINDINGS: Images were obtained during right hip replacement. The visualized
portions of the acetabular and femoral components are in good
position on a single AP view.
IMPRESSION: Images taken during right hip replacement as above.

## 2020-09-11 IMAGING — DX DG HIP (WITH OR WITHOUT PELVIS) 2-3V RIGHT
2 series · 2 of 2 positions shown · non-contrast
Comparison: None.

CLINICAL DATA: Status post right hip replacement

EXAM:
DG HIP (WITH OR WITHOUT PELVIS) 2-3V RIGHT

[hip ap]
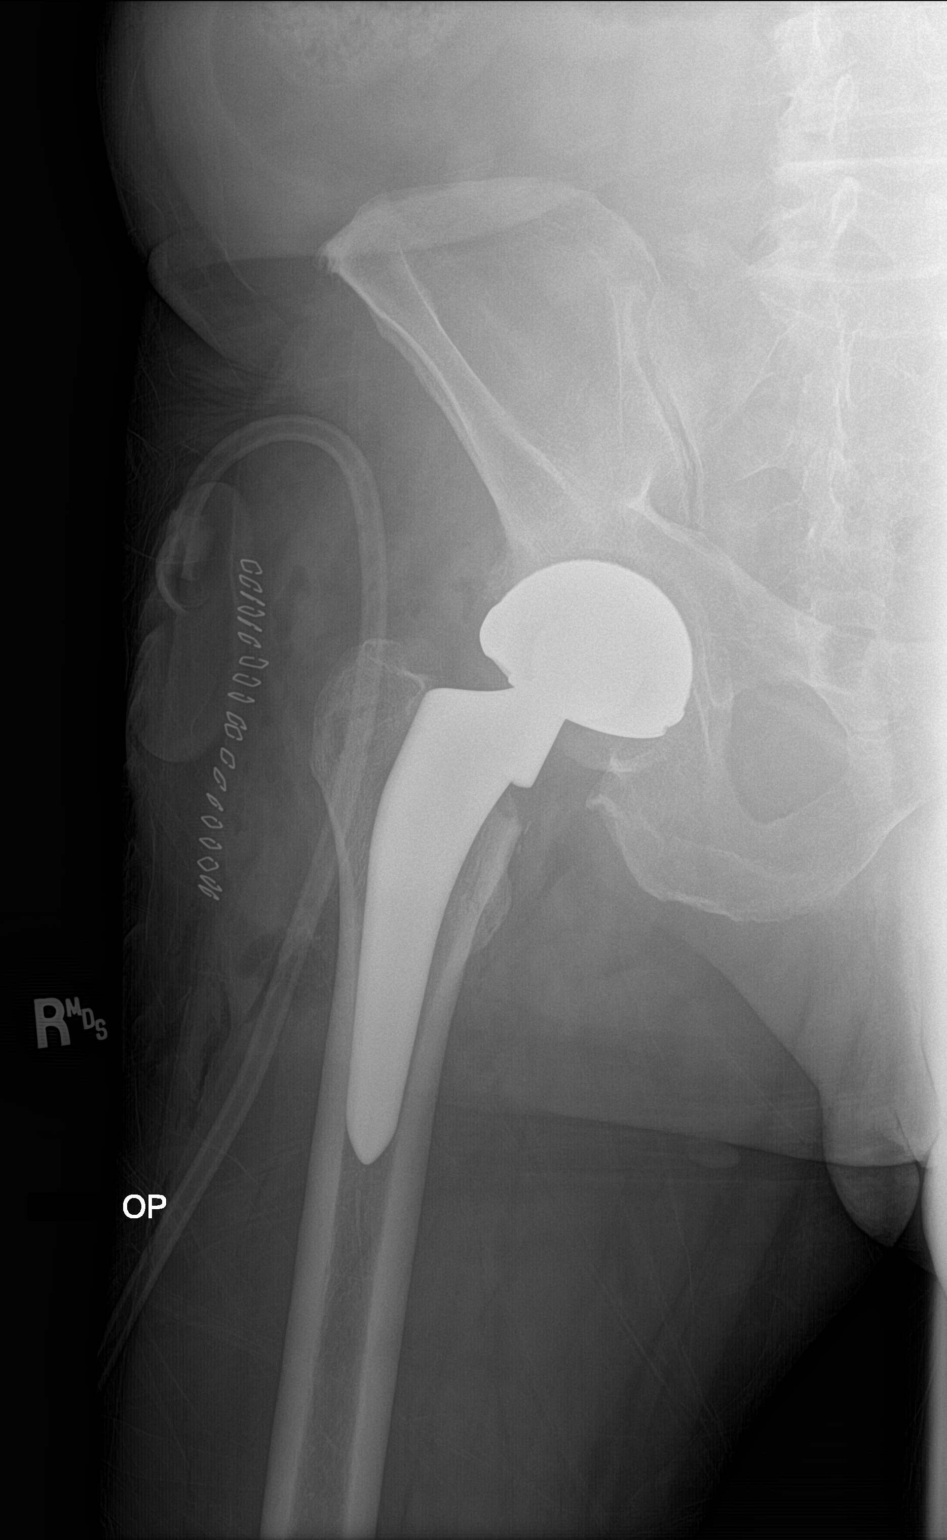

[hip lat]
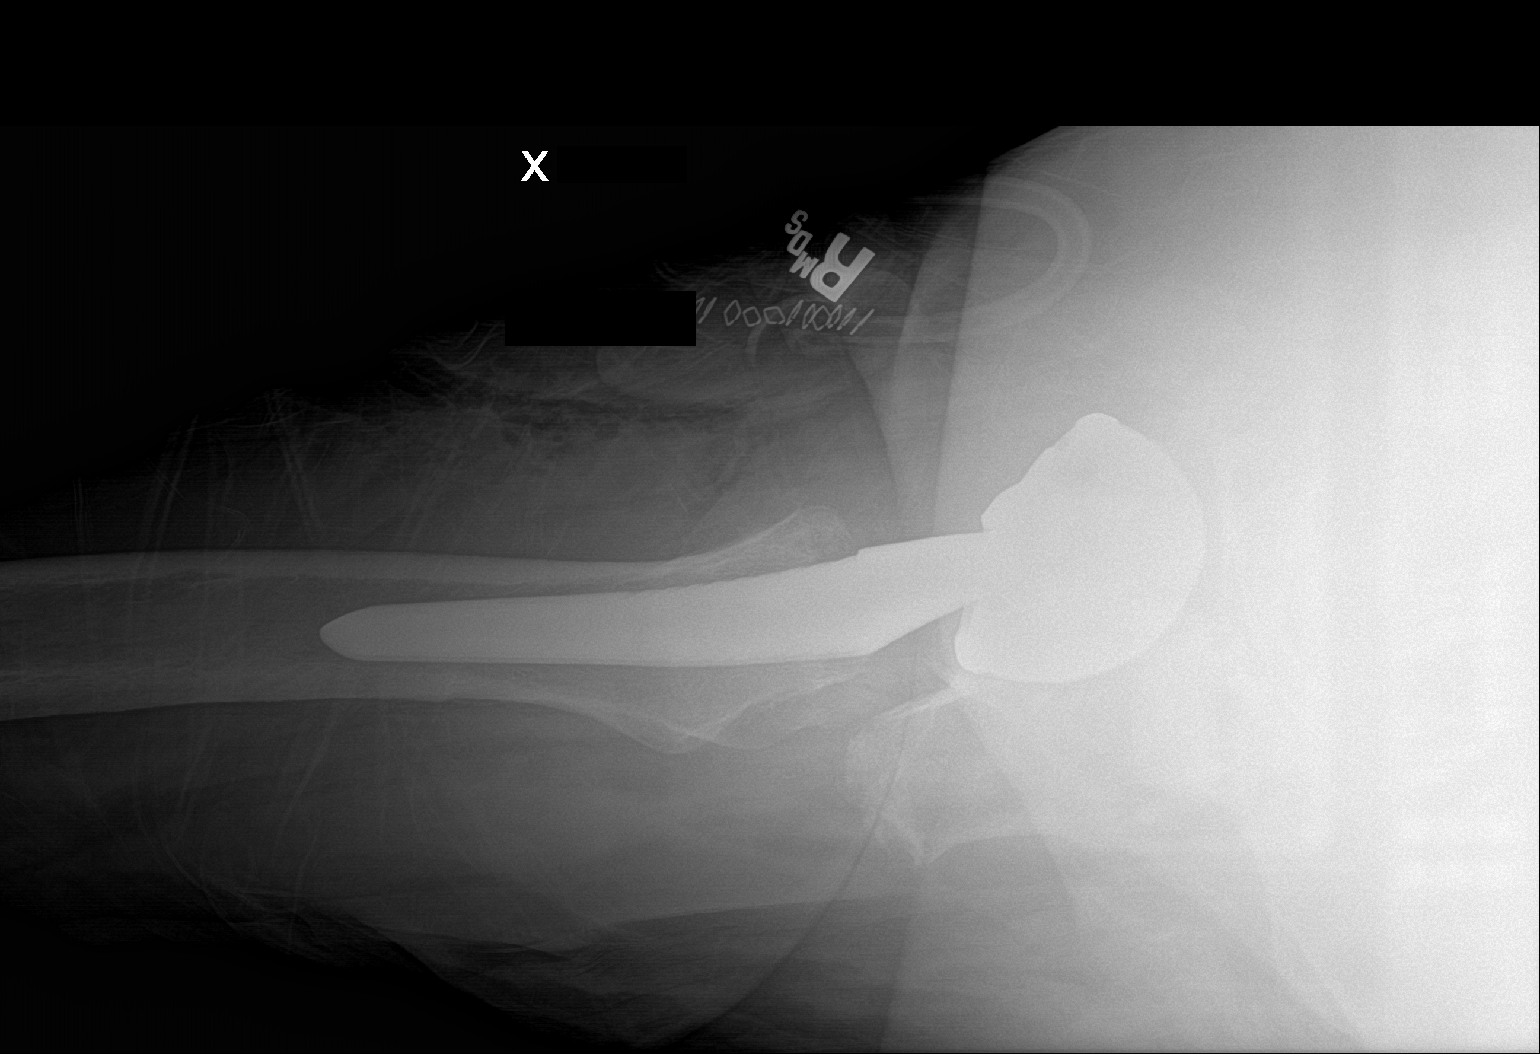

[2 of 2 positions shown; findings below may reference images not displayed]

FINDINGS: The patient is status post right hip replacement. A surgical drain
and skin staples are noted. Postoperative air seen in the soft
tissues. The acetabular and femoral components are in good position.
IMPRESSION: Right hip replacement as above.

## 2021-01-22 ENCOUNTER — Other Ambulatory Visit (HOSPITAL_COMMUNITY): Payer: Self-pay | Admitting: Physician Assistant

## 2021-01-22 ENCOUNTER — Other Ambulatory Visit: Payer: Self-pay

## 2021-01-22 ENCOUNTER — Ambulatory Visit
Admission: RE | Admit: 2021-01-22 | Discharge: 2021-01-22 | Disposition: A | Discharge status: Home / Self Care | Payer: Managed Care, Other (non HMO) | Source: Ambulatory Visit | Attending: Physician Assistant | Admitting: Physician Assistant

## 2021-01-22 ENCOUNTER — Other Ambulatory Visit: Payer: Self-pay | Admitting: Physician Assistant

## 2021-01-22 DIAGNOSIS — R42 Dizziness and giddiness: Secondary | ICD-10-CM | POA: Insufficient documentation

## 2021-01-22 DIAGNOSIS — R519 Headache, unspecified: Secondary | ICD-10-CM | POA: Insufficient documentation

## 2021-08-10 ENCOUNTER — Encounter: Payer: Self-pay | Admitting: Ophthalmology

## 2021-08-19 NOTE — Discharge Instructions (Signed)

## 2021-08-23 ENCOUNTER — Other Ambulatory Visit: Payer: Self-pay

## 2021-08-23 ENCOUNTER — Encounter
Admission: RE | Disposition: A | Discharge status: Home / Self Care | Payer: Self-pay | Source: Home / Self Care | Attending: Ophthalmology

## 2021-08-23 ENCOUNTER — Ambulatory Visit
Admission: RE | Admit: 2021-08-23 | Discharge: 2021-08-23 | Disposition: A | Discharge status: Home / Self Care | Payer: Managed Care, Other (non HMO) | Attending: Ophthalmology | Admitting: Ophthalmology

## 2021-08-23 ENCOUNTER — Ambulatory Visit: Payer: Managed Care, Other (non HMO) | Admitting: Anesthesiology

## 2021-08-23 ENCOUNTER — Encounter: Payer: Self-pay | Admitting: Ophthalmology

## 2021-08-23 DIAGNOSIS — H2511 Age-related nuclear cataract, right eye: Secondary | ICD-10-CM | POA: Diagnosis present

## 2021-08-23 DIAGNOSIS — Z87891 Personal history of nicotine dependence: Secondary | ICD-10-CM | POA: Diagnosis not present

## 2021-08-23 HISTORY — PX: CATARACT EXTRACTION W/PHACO: SHX586

## 2021-08-23 SURGERY — PHACOEMULSIFICATION, CATARACT, WITH IOL INSERTION
Anesthesia: Monitor Anesthesia Care | Site: Eye | Laterality: Right

## 2021-08-23 MED ORDER — TETRACAINE HCL 0.5 % OP SOLN
1.0000 [drp] | OPHTHALMIC | Status: DC | PRN
  Administered 2021-08-23 (×3): 1 [drp] via OPHTHALMIC

## 2021-08-23 MED ORDER — ACETAMINOPHEN 160 MG/5ML PO SOLN: 325.0000 mg | Freq: Once | ORAL | Status: DC

## 2021-08-23 MED ORDER — FENTANYL CITRATE (PF) 100 MCG/2ML IJ SOLN
INTRAMUSCULAR | Status: DC | PRN
  Administered 2021-08-23: 50 ug via INTRAVENOUS

## 2021-08-23 MED ORDER — MIDAZOLAM HCL 2 MG/2ML IJ SOLN
INTRAMUSCULAR | Status: DC | PRN
  Administered 2021-08-23: 2 mg via INTRAVENOUS

## 2021-08-23 MED ORDER — ARMC OPHTHALMIC DILATING DROPS
1.0000 "application " | OPHTHALMIC | Status: AC | PRN
  Administered 2021-08-23 (×3): 1 via OPHTHALMIC

## 2021-08-23 MED ORDER — ACETAMINOPHEN 325 MG PO TABS
325.0000 mg | ORAL_TABLET | Freq: Once | ORAL | Status: DC
Start: 2021-08-23 — End: 2021-08-23

## 2021-08-23 MED ORDER — SIGHTPATH DOSE#1 BSS IO SOLN
INTRAOCULAR | Status: DC | PRN
  Administered 2021-08-23: 99 mL via OPHTHALMIC

## 2021-08-23 MED ORDER — LACTATED RINGERS IV SOLN: INTRAVENOUS | Status: DC

## 2021-08-23 MED ORDER — LIDOCAINE HCL (PF) 2 % IJ SOLN
INTRAOCULAR | Status: DC | PRN
  Administered 2021-08-23: 1 mL via INTRAOCULAR

## 2021-08-23 MED ORDER — SIGHTPATH DOSE#1 BSS IO SOLN
INTRAOCULAR | Status: DC | PRN
  Administered 2021-08-23: 15 mL

## 2021-08-23 MED ORDER — SIGHTPATH DOSE#1 SODIUM HYALURONATE 23 MG/ML IO SOLUTION
PREFILLED_SYRINGE | INTRAOCULAR | Status: DC | PRN
  Administered 2021-08-23: 0.6 mL via INTRAOCULAR

## 2021-08-23 MED ORDER — SIGHTPATH DOSE#1 SODIUM HYALURONATE 10 MG/ML IO SOLUTION
PREFILLED_SYRINGE | INTRAOCULAR | Status: DC | PRN
  Administered 2021-08-23: 0.85 mL via INTRAOCULAR

## 2021-08-23 MED ORDER — MOXIFLOXACIN HCL 0.5 % OP SOLN
OPHTHALMIC | Status: DC | PRN
  Administered 2021-08-23: 0.2 mL via OPHTHALMIC

## 2021-08-23 SURGICAL SUPPLY — 17 items
CANNULA ANT/CHMB 27GA (MISCELLANEOUS) ×2 IMPLANT
DISSECTOR HYDRO NUCLEUS 50X22 (MISCELLANEOUS) ×2 IMPLANT
GLOVE SURG GAMMEX PI TX LF 7.5 (GLOVE) ×2 IMPLANT
GLOVE SURG SYN 8.5  E (GLOVE) ×1
GLOVE SURG SYN 8.5 E (GLOVE) ×1 IMPLANT
GOWN STRL REUS W/ TWL LRG LVL3 (GOWN DISPOSABLE) ×2 IMPLANT
GOWN STRL REUS W/TWL LRG LVL3 (GOWN DISPOSABLE) ×4
LENS IOL TECNIS EYHANCE 20.5 (Intraocular Lens) ×2 IMPLANT
MARKER SKIN DUAL TIP RULER LAB (MISCELLANEOUS) ×2 IMPLANT
NEEDLE FILTER BLUNT 18X 1/2SAF (NEEDLE) ×1
NEEDLE FILTER BLUNT 18X1 1/2 (NEEDLE) ×1 IMPLANT
PACK EYE AFTER SURG (MISCELLANEOUS) ×2 IMPLANT
SYR 3ML LL SCALE MARK (SYRINGE) ×2 IMPLANT
SYR 5ML LL (SYRINGE) ×2 IMPLANT
SYR TB 1ML LUER SLIP (SYRINGE) ×2 IMPLANT
WATER STERILE IRR 250ML POUR (IV SOLUTION) ×2 IMPLANT
WIPE NON LINTING 3.25X3.25 (MISCELLANEOUS) ×2 IMPLANT

## 2021-08-23 NOTE — Op Note (Signed)
OPERATIVE NOTE  Brandon Galvan [id number] 08/23/2021   PREOPERATIVE DIAGNOSIS:  Nuclear sclerotic cataract right eye.  H25.11   POSTOPERATIVE DIAGNOSIS:    Nuclear sclerotic cataract right eye.     PROCEDURE:  Phacoemusification with posterior chamber intraocular lens placement of the right eye   LENS:   Implant Name Type Inv. Item Serial No. Manufacturer Lot No. LRB No. Used Action  LENS IOL TECNIS EYHANCE 20.5 - [id number] Intraocular Lens LENS IOL TECNIS EYHANCE 20.5 [id number]   Right 1 Implanted       Procedure(s) with comments: CATARACT EXTRACTION PHACO AND INTRAOCULAR LENS PLACEMENT (IOC) RIGHT (Right) - LEAVE ARRIVAL 8 3.83 00:32.1  DIB00 +20.5   SURGEON:  Benay Pillow, MD, MPH  ANESTHESIOLOGIST: Anesthesiologist: Ronelle Nigh, MD CRNA: Silvana Newness, CRNA   ANESTHESIA:  Topical with tetracaine drops augmented with 1% preservative-free intracameral lidocaine.  ESTIMATED BLOOD LOSS: less than 1 mL.   COMPLICATIONS:  None.   DESCRIPTION OF PROCEDURE:  The patient was identified in the holding room and transported to the operating room and placed in the supine position under the operating microscope.  The right eye was identified as the operative eye and it was prepped and draped in the usual sterile ophthalmic fashion.   A 1.0 millimeter clear-corneal paracentesis was made at the 10:30 position. 0.5 ml of preservative-free 1% lidocaine with epinephrine was injected into the anterior chamber.  The anterior chamber was filled with Healon 5 viscoelastic.  A 2.4 millimeter keratome was used to make a near-clear corneal incision at the 8:00 position.  A curvilinear capsulorrhexis was made with a cystotome and capsulorrhexis forceps.  Balanced salt solution was used to hydrodissect and hydrodelineate the nucleus.   Phacoemulsification was then used in stop and chop fashion to remove the lens nucleus and epinucleus.  The remaining cortex was then removed using the  irrigation and aspiration handpiece. Healon was then placed into the capsular bag to distend it for lens placement.  A lens was then injected into the capsular bag.  The remaining viscoelastic was aspirated.   Wounds were hydrated with balanced salt solution.  The anterior chamber was inflated to a physiologic pressure with balanced salt solution.   Intracameral vigamox 0.1 mL undiluted was injected into the eye and a drop placed onto the ocular surface.  No wound leaks were noted.  The patient was taken to the recovery room in stable condition without complications of anesthesia or surgery  Benay Pillow 08/23/2021, 8:55 AM

## 2021-08-23 NOTE — H&P (Signed)
[place]   Primary Care Physician:  Rusty Aus, MD Ophthalmologist: Dr. Benay Pillow  Pre-Procedure History & Physical: HPI:  Brandon Galvan is a 65 y.o. male here for cataract surgery.   Past Medical History:  Diagnosis Date   Arthritis    right hip   Depression    h/o   Family history of diabetes mellitus    History of kidney stones    still has a kidney stone as of 04-16-19    Hyperlipidemia    Kidney stone     Past Surgical History:  Procedure Laterality Date   COLONOSCOPY WITH PROPOFOL N/A 06/15/2017   Procedure: COLONOSCOPY WITH PROPOFOL;  Surgeon: Jonathon Bellows, MD;  Location: [facility] ENDOSCOPY;  Service: Gastroenterology;  Laterality: N/A;   LITHOTRIPSY     TOTAL HIP ARTHROPLASTY Right 04/25/2019   Procedure: TOTAL HIP ARTHROPLASTY ANTERIOR APPROACH;  Surgeon: Hessie Knows, MD;  Location: [facility] ORS;  Service: Orthopedics;  Laterality: Right;    Prior to Admission medications   Medication Sig Start Date End Date Taking? Authorizing Provider  acetaminophen (TYLENOL) 500 MG tablet Take 500 mg by mouth 2 (two) times daily.   Yes [provider]  brimonidine-timolol (COMBIGAN) 0.2-0.5 % ophthalmic solution Place 1 drop into both eyes every 12 (twelve) hours.   Yes [provider]  meloxicam (MOBIC) 15 MG tablet Take 15 mg by mouth daily.   Yes [provider]  HYDROcodone-acetaminophen (NORCO) 7.5-325 MG tablet Take 1 tablet by mouth 4 (four) times daily as needed. Patient not taking: Reported on 08/10/2021 09/07/20   [provider]  tadalafil (CIALIS) 10 MG tablet Take 1-2 tablets (10-20 mg total) by mouth daily as needed for erectile dysfunction. Patient not taking: Reported on 08/10/2021 09/09/20   [name], MD    Allergies as of 06/28/2021 - Review Complete 09/09/2020  Allergen Reaction Noted   Penicillin g Anaphylaxis, Hives, and Swelling 09/18/2018   Amoxicillin Rash 01/13/2016   Benadryl [diphenhydramine]  Rash 04/16/2019    Family History  Problem Relation Age of Onset   Osteoporosis Mother    Arthritis Mother    Dementia Mother    Hypertension Father    Diabetes Father    Diabetes Sister    Hypertension Sister    Healthy Daughter    Healthy Son    Healthy Daughter    Healthy Daughter     Social History   Socioeconomic History   Marital status: Married    Spouse name: Not on file   Number of children: Not on file   Years of education: Not on file   Highest education level: Not on file  Occupational History   Not on file  Tobacco Use   Smoking status: Former    Types: Cigars    Quit date: 04/15/2009    Years since quitting: 12.3   Smokeless tobacco: Never  Vaping Use   Vaping Use: Never used  Substance and Sexual Activity   Alcohol use: Yes    Alcohol/week: 13.0 standard drinks    Types: 10 Cans of beer, 3 Standard drinks or equivalent per week   Drug use: Never   Sexual activity: Yes    Birth control/protection: None  Other Topics Concern   Not on file  Social History Narrative   Not on file   Social Determinants of Health   Financial Resource Strain: Not on file  Food Insecurity: Not on file  Transportation Needs: Not on file  Physical Activity:  Not on file  Stress: Not on file  Social Connections: Not on file  Intimate Partner Violence: Not on file    Review of Systems: See HPI, otherwise negative ROS  Physical Exam: BP (!) 163/97   Pulse 72   Temp 97.8 F (36.6 C) (Temporal)   Resp 18   Ht 5\' 7"  (1.702 m)   Wt 88.9 kg   SpO2 97%   BMI 30.70 kg/m  General:   Alert, cooperative in NAD Head:  Normocephalic and atraumatic. Respiratory:  Normal work of breathing. Cardiovascular:  RRR  Impression/Plan: Brandon Galvan is here for cataract surgery.  Risks, benefits, limitations, and alternatives regarding cataract surgery have been reviewed with the patient.  Questions have been answered.  All parties agreeable.   Benay Pillow, MD   08/23/2021, 8:26 AM

## 2021-08-23 NOTE — Anesthesia Preprocedure Evaluation (Signed)
Anesthesia Evaluation  Patient identified by MRN, date of birth, ID band Patient awake    Reviewed: Allergy & Precautions, H&P , NPO status , Patient's Chart, lab work & pertinent test results  Airway Mallampati: II  TM Distance: >3 FB Neck ROM: full    Dental no notable dental hx.    Pulmonary former smoker,    Pulmonary exam normal breath sounds clear to auscultation       Cardiovascular Normal cardiovascular exam Rhythm:regular Rate:Normal     Neuro/Psych PSYCHIATRIC DISORDERS    GI/Hepatic   Endo/Other    Renal/GU      Musculoskeletal   Abdominal   Peds  Hematology   Anesthesia Other Findings   Reproductive/Obstetrics                             Anesthesia Physical Anesthesia Plan  ASA: 2  Anesthesia Plan: MAC   Post-op Pain Management:    Induction:   PONV Risk Score and Plan: 1 and Treatment may vary due to age or medical condition, TIVA and Midazolam  Airway Management Planned:   Additional Equipment:   Intra-op Plan:   Post-operative Plan:   Informed Consent: I have reviewed the patients History and Physical, chart, labs and discussed the procedure including the risks, benefits and alternatives for the proposed anesthesia with the patient or authorized representative who has indicated his/her understanding and acceptance.     Dental Advisory Given  Plan Discussed with: CRNA  Anesthesia Plan Comments:         Anesthesia Quick Evaluation

## 2021-08-23 NOTE — Transfer of Care (Signed)
Immediate Anesthesia Transfer of Care Note  Patient: Brandon Galvan  Procedure(s) Performed: CATARACT EXTRACTION PHACO AND INTRAOCULAR LENS PLACEMENT (IOC) RIGHT (Right: Eye)  Patient Location: PACU  Anesthesia Type: MAC  Level of Consciousness: awake, alert  and patient cooperative  Airway and Oxygen Therapy: Patient Spontanous Breathing and Patient connected to supplemental oxygen  Post-op Assessment: Post-op Vital signs reviewed, Patient's Cardiovascular Status Stable, Respiratory Function Stable, Patent Airway and No signs of Nausea or vomiting  Post-op Vital Signs: Reviewed and stable  Complications: No notable events documented.

## 2021-08-23 NOTE — Anesthesia Postprocedure Evaluation (Signed)
Anesthesia Post Note  Patient: Brandon Galvan  Procedure(s) Performed: CATARACT EXTRACTION PHACO AND INTRAOCULAR LENS PLACEMENT (IOC) RIGHT (Right: Eye)     Patient location during evaluation: PACU Anesthesia Type: MAC Level of consciousness: awake and alert and oriented Pain management: satisfactory to patient Vital Signs Assessment: post-procedure vital signs reviewed and stable Respiratory status: spontaneous breathing, nonlabored ventilation and respiratory function stable Cardiovascular status: blood pressure returned to baseline and stable Postop Assessment: Adequate PO intake and No signs of nausea or vomiting Anesthetic complications: no   No notable events documented.  Raliegh Ip

## 2021-08-24 ENCOUNTER — Encounter: Payer: Self-pay | Admitting: Ophthalmology

## 2021-08-31 ENCOUNTER — Encounter: Payer: Self-pay | Admitting: Ophthalmology

## 2021-08-31 ENCOUNTER — Other Ambulatory Visit: Payer: Self-pay | Admitting: Orthopedic Surgery

## 2021-09-02 NOTE — Discharge Instructions (Signed)

## 2021-09-03 ENCOUNTER — Other Ambulatory Visit: Payer: Managed Care, Other (non HMO)

## 2021-09-03 ENCOUNTER — Other Ambulatory Visit: Payer: Self-pay

## 2021-09-03 DIAGNOSIS — R972 Elevated prostate specific antigen [PSA]: Secondary | ICD-10-CM

## 2021-09-04 LAB — PSA TOTAL (REFLEX TO FREE): Prostate Specific Ag, Serum: 2.6 ng/mL (ref 0.0–4.0)

## 2021-09-06 ENCOUNTER — Encounter
Admission: RE | Disposition: A | Discharge status: Home / Self Care | Payer: Self-pay | Source: Home / Self Care | Attending: Ophthalmology

## 2021-09-06 ENCOUNTER — Other Ambulatory Visit: Payer: Self-pay

## 2021-09-06 ENCOUNTER — Other Ambulatory Visit: Payer: Managed Care, Other (non HMO)

## 2021-09-06 ENCOUNTER — Encounter: Payer: Self-pay | Admitting: Ophthalmology

## 2021-09-06 ENCOUNTER — Ambulatory Visit: Payer: Managed Care, Other (non HMO) | Admitting: Anesthesiology

## 2021-09-06 ENCOUNTER — Ambulatory Visit
Admission: RE | Admit: 2021-09-06 | Discharge: 2021-09-06 | Disposition: A | Discharge status: Home / Self Care | Payer: Managed Care, Other (non HMO) | Attending: Ophthalmology | Admitting: Ophthalmology

## 2021-09-06 DIAGNOSIS — Z87891 Personal history of nicotine dependence: Secondary | ICD-10-CM | POA: Diagnosis not present

## 2021-09-06 DIAGNOSIS — Z6831 Body mass index (BMI) 31.0-31.9, adult: Secondary | ICD-10-CM | POA: Diagnosis not present

## 2021-09-06 DIAGNOSIS — H2512 Age-related nuclear cataract, left eye: Secondary | ICD-10-CM | POA: Diagnosis present

## 2021-09-06 DIAGNOSIS — H409 Unspecified glaucoma: Secondary | ICD-10-CM | POA: Diagnosis not present

## 2021-09-06 DIAGNOSIS — M1990 Unspecified osteoarthritis, unspecified site: Secondary | ICD-10-CM | POA: Insufficient documentation

## 2021-09-06 HISTORY — PX: CATARACT EXTRACTION W/PHACO: SHX586

## 2021-09-06 SURGERY — PHACOEMULSIFICATION, CATARACT, WITH IOL INSERTION
Anesthesia: Monitor Anesthesia Care | Site: Eye | Laterality: Left

## 2021-09-06 MED ORDER — TETRACAINE HCL 0.5 % OP SOLN
1.0000 [drp] | OPHTHALMIC | Status: AC | PRN
  Administered 2021-09-06 (×3): 1 [drp] via OPHTHALMIC

## 2021-09-06 MED ORDER — SIGHTPATH DOSE#1 SODIUM HYALURONATE 10 MG/ML IO SOLUTION
PREFILLED_SYRINGE | INTRAOCULAR | Status: DC | PRN
  Administered 2021-09-06: 0.85 mL via INTRAOCULAR

## 2021-09-06 MED ORDER — LIDOCAINE HCL (PF) 2 % IJ SOLN
INTRAOCULAR | Status: DC | PRN
  Administered 2021-09-06: 08:00:00 1 mL via INTRAOCULAR

## 2021-09-06 MED ORDER — SIGHTPATH DOSE#1 BSS IO SOLN
INTRAOCULAR | Status: DC | PRN
  Administered 2021-09-06: 08:00:00 82 mL via OPHTHALMIC

## 2021-09-06 MED ORDER — OXYCODONE HCL 5 MG PO TABS: 5.0000 mg | ORAL_TABLET | Freq: Once | ORAL | Status: DC | PRN

## 2021-09-06 MED ORDER — LACTATED RINGERS IV SOLN: INTRAVENOUS | Status: DC

## 2021-09-06 MED ORDER — MIDAZOLAM HCL 2 MG/2ML IJ SOLN
INTRAMUSCULAR | Status: DC | PRN
  Administered 2021-09-06: 2 mg via INTRAVENOUS

## 2021-09-06 MED ORDER — FENTANYL CITRATE (PF) 100 MCG/2ML IJ SOLN
INTRAMUSCULAR | Status: DC | PRN
  Administered 2021-09-06: 50 ug via INTRAVENOUS

## 2021-09-06 MED ORDER — OXYCODONE HCL 5 MG/5ML PO SOLN: 5.0000 mg | Freq: Once | ORAL | Status: DC | PRN

## 2021-09-06 MED ORDER — SIGHTPATH DOSE#1 BSS IO SOLN
INTRAOCULAR | Status: DC | PRN
  Administered 2021-09-06: 15 mL

## 2021-09-06 MED ORDER — MOXIFLOXACIN HCL 0.5 % OP SOLN
OPHTHALMIC | Status: DC | PRN
  Administered 2021-09-06: 0.2 mL via OPHTHALMIC

## 2021-09-06 MED ORDER — SIGHTPATH DOSE#1 SODIUM HYALURONATE 23 MG/ML IO SOLUTION
PREFILLED_SYRINGE | INTRAOCULAR | Status: DC | PRN
  Administered 2021-09-06: 0.6 mL via INTRAOCULAR

## 2021-09-06 MED ORDER — ARMC OPHTHALMIC DILATING DROPS
1.0000 "application " | OPHTHALMIC | Status: AC | PRN
  Administered 2021-09-06 (×3): 1 via OPHTHALMIC

## 2021-09-06 SURGICAL SUPPLY — 14 items
CANNULA ANT/CHMB 27G (MISCELLANEOUS) IMPLANT
CANNULA ANT/CHMB 27GA (MISCELLANEOUS) ×3 IMPLANT
DISSECTOR HYDRO NUCLEUS 50X22 (MISCELLANEOUS) ×3 IMPLANT
GLOVE SURG GAMMEX PI TX LF 7.5 (GLOVE) ×3 IMPLANT
GLOVE SURG SYN 8.5  E (GLOVE) ×2
GLOVE SURG SYN 8.5 E (GLOVE) ×1 IMPLANT
GLOVE SURG SYN 8.5 PF PI (GLOVE) ×1 IMPLANT
LENS IOL TECNIS EYHANCE 20.0 (Intraocular Lens) ×2 IMPLANT
NDL FILTER BLUNT 18X1 1/2 (NEEDLE) ×1 IMPLANT
NEEDLE FILTER BLUNT 18X 1/2SAF (NEEDLE) ×2
NEEDLE FILTER BLUNT 18X1 1/2 (NEEDLE) ×1 IMPLANT
SYR 3ML LL SCALE MARK (SYRINGE) ×3 IMPLANT
SYR 5ML LL (SYRINGE) ×3 IMPLANT
WATER STERILE IRR 250ML POUR (IV SOLUTION) ×3 IMPLANT

## 2021-09-06 NOTE — H&P (Signed)
[place]   Primary Care Physician:  Rusty Aus, MD Ophthalmologist: Dr. Benay Pillow  Pre-Procedure History & Physical: HPI:  GERON MULFORD is a 65 y.o. male here for cataract surgery.   Past Medical History:  Diagnosis Date   Arthritis    right hip   Depression    h/o   Family history of diabetes mellitus    History of kidney stones    still has a kidney stone as of 04-16-19    Hyperlipidemia    Kidney stone     Past Surgical History:  Procedure Laterality Date   CATARACT EXTRACTION W/PHACO Right 08/23/2021   Procedure: CATARACT EXTRACTION PHACO AND INTRAOCULAR LENS PLACEMENT ([place]) RIGHT;  Surgeon: Eulogio Bear, MD;  Location: [facility];  Service: Ophthalmology;  Laterality: Right;  LEAVE ARRIVAL 8 3.83 00:32.1   COLONOSCOPY WITH PROPOFOL N/A 06/15/2017   Procedure: COLONOSCOPY WITH PROPOFOL;  Surgeon: Jonathon Bellows, MD;  Location[facility] ENDOSCOPY;  Service: Gastroenterology;  Laterality: N/A;   LITHOTRIPSY     TOTAL HIP ARTHROPLASTY Right 04/25/2019   Procedure: TOTAL HIP ARTHROPLASTY ANTERIOR APPROACH;  Surgeon: Hessie Knows, MD;  Location: [facility] ORS;  Service: Orthopedics;  Laterality: Right;    Prior to Admission medications   Medication Sig Start Date End Date Taking? Authorizing Provider  acetaminophen (TYLENOL) 500 MG tablet Take 500 mg by mouth 2 (two) times daily.   Yes [provider]  brimonidine-timolol (COMBIGAN) 0.2-0.5 % ophthalmic solution Place 1 drop into both eyes every 12 (twelve) hours.   Yes [provider]  meloxicam (MOBIC) 15 MG tablet Take 15 mg by mouth daily.   Yes [provider]  HYDROcodone-acetaminophen (NORCO) 7.5-325 MG tablet Take 1 tablet by mouth 4 (four) times daily as needed. Patient not taking: Reported on 08/10/2021 09/07/20   [provider]  tadalafil (CIALIS) 10 MG tablet Take 1-2 tablets (10-20 mg total) by mouth daily as needed for erectile dysfunction. Patient not  taking: Reported on 08/10/2021 09/09/20   [name], MD    Allergies as of 06/28/2021 - Review Complete 09/09/2020  Allergen Reaction Noted   Penicillin g Anaphylaxis, Hives, and Swelling 09/18/2018   Amoxicillin Rash 01/13/2016   Benadryl [diphenhydramine] Rash 04/16/2019    Family History  Problem Relation Age of Onset   Osteoporosis Mother    Arthritis Mother    Dementia Mother    Hypertension Father    Diabetes Father    Diabetes Sister    Hypertension Sister    Healthy Daughter    Healthy Son    Healthy Daughter    Healthy Daughter     Social History   Socioeconomic History   Marital status: Married    Spouse name: Not on file   Number of children: Not on file   Years of education: Not on file   Highest education level: Not on file  Occupational History   Not on file  Tobacco Use   Smoking status: Former    Types: Cigars    Quit date: 04/15/2009    Years since quitting: 12.4   Smokeless tobacco: Never  Vaping Use   Vaping Use: Never used  Substance and Sexual Activity   Alcohol use: Yes    Alcohol/week: 13.0 standard drinks    Types: 10 Cans of beer, 3 Standard drinks or equivalent per week   Drug use: Never   Sexual activity: Yes    Birth control/protection: None  Other Topics Concern  Not on file  Social History Narrative   Not on file   Social Determinants of Health   Financial Resource Strain: Not on file  Food Insecurity: Not on file  Transportation Needs: Not on file  Physical Activity: Not on file  Stress: Not on file  Social Connections: Not on file  Intimate Partner Violence: Not on file    Review of Systems: See HPI, otherwise negative ROS  Physical Exam: BP (!) 162/99    Pulse 70    Temp (!) 97.3 F (36.3 C) (Temporal)    Resp 16    Ht 5\' 7"  (1.702 m)    Wt 88.5 kg    SpO2 96%    BMI 30.54 kg/m  General:   Alert, cooperative in NAD Head:  Normocephalic and atraumatic. Respiratory:  Normal work of  breathing. Cardiovascular:  RRR  Impression/Plan: ROLAN WRIGHTSMAN is here for cataract surgery.  Risks, benefits, limitations, and alternatives regarding cataract surgery have been reviewed with the patient.  Questions have been answered.  All parties agreeable.   Benay Pillow, MD  09/06/2021, 7:55 AM

## 2021-09-06 NOTE — Anesthesia Postprocedure Evaluation (Signed)
Anesthesia Post Note  Patient: Brandon Galvan  Procedure(s) Performed: CATARACT EXTRACTION PHACO AND INTRAOCULAR LENS PLACEMENT (IOC) LEFT (Left: Eye)     Patient location during evaluation: PACU Anesthesia Type: MAC Level of consciousness: awake and alert Pain management: pain level controlled Vital Signs Assessment: post-procedure vital signs reviewed and stable Respiratory status: spontaneous breathing, nonlabored ventilation, respiratory function stable and patient connected to nasal cannula oxygen Cardiovascular status: stable and blood pressure returned to baseline Postop Assessment: no apparent nausea or vomiting Anesthetic complications: no   No notable events documented.  Fidel Levy

## 2021-09-06 NOTE — Op Note (Signed)
OPERATIVE NOTE  FRASER BUSCHE [id number] 09/06/2021   PREOPERATIVE DIAGNOSIS:  Nuclear sclerotic cataract left eye.  H25.12   POSTOPERATIVE DIAGNOSIS:    Nuclear sclerotic cataract left eye.     PROCEDURE:  Phacoemusification with posterior chamber intraocular lens placement of the left eye   LENS:   Implant Name Type Inv. Item Serial No. Manufacturer Lot No. LRB No. Used Action  LENS IOL TECNIS EYHANCE 20.0 - [id number] Intraocular Lens LENS IOL TECNIS EYHANCE 20.0 [id number] JOHNSON   Left 1 Implanted      Procedure(s) with comments: CATARACT EXTRACTION PHACO AND INTRAOCULAR LENS PLACEMENT (IOC) LEFT (Left) - 2.23 00:25.8  DIB00 +20.0   ULTRASOUND TIME: 0 minutes 25 seconds.  CDE 2.23   SURGEON:  Benay Pillow, MD, MPH   ANESTHESIA:  Topical with tetracaine drops augmented with 1% preservative-free intracameral lidocaine.  ESTIMATED BLOOD LOSS: <1 mL   COMPLICATIONS:  None.   DESCRIPTION OF PROCEDURE:  The patient was identified in the holding room and transported to the operating room and placed in the supine position under the operating microscope.  The left eye was identified as the operative eye and it was prepped and draped in the usual sterile ophthalmic fashion.   A 1.0 millimeter clear-corneal paracentesis was made at the 5:00 position. 0.5 ml of preservative-free 1% lidocaine with epinephrine was injected into the anterior chamber.  The anterior chamber was filled with Healon 5 viscoelastic.  A 2.4 millimeter keratome was used to make a near-clear corneal incision at the 2:00 position.  A curvilinear capsulorrhexis was made with a cystotome and capsulorrhexis forceps.  Balanced salt solution was used to hydrodissect and hydrodelineate the nucleus.   Phacoemulsification was then used in stop and chop fashion to remove the lens nucleus and epinucleus.  The remaining cortex was then removed using the irrigation and aspiration handpiece. Healon was then placed into the  capsular bag to distend it for lens placement.  A lens was then injected into the capsular bag.  The remaining viscoelastic was aspirated.   Wounds were hydrated with balanced salt solution.  The anterior chamber was inflated to a physiologic pressure with balanced salt solution.  Intracameral vigamox 0.1 mL undiltued was injected into the eye and a drop placed onto the ocular surface.  No wound leaks were noted.  The patient was taken to the recovery room in stable condition without complications of anesthesia or surgery  Benay Pillow 09/06/2021, 8:26 AM

## 2021-09-06 NOTE — Anesthesia Preprocedure Evaluation (Signed)
Anesthesia Evaluation  Patient identified by MRN, date of birth, ID band Patient awake    Reviewed: NPO status   History of Anesthesia Complications Negative for: history of anesthetic complications  Airway Mallampati: II  TM Distance: >3 FB Neck ROM: full    Dental no notable dental hx.    Pulmonary neg pulmonary ROS, former smoker,    Pulmonary exam normal        Cardiovascular Exercise Tolerance: Good negative cardio ROS Normal cardiovascular exam     Neuro/Psych glaucoma negative psych ROS   GI/Hepatic negative GI ROS, Neg liver ROS,   Endo/Other  Morbid obesity (bmi 31)  Renal/GU negative Renal ROS  negative genitourinary   Musculoskeletal  (+) Arthritis ,   Abdominal   Peds  Hematology negative hematology ROS (+)   Anesthesia Other Findings Pcp: Yevonne Pax, MD at 01/08/2021 ;  Had ecce  2 wks ago.  Reproductive/Obstetrics                             Anesthesia Physical Anesthesia Plan  ASA: 2  Anesthesia Plan: MAC   Post-op Pain Management:    Induction:   PONV Risk Score and Plan: 1 and TIVA  Airway Management Planned:   Additional Equipment:   Intra-op Plan:   Post-operative Plan:   Informed Consent: I have reviewed the patients History and Physical, chart, labs and discussed the procedure including the risks, benefits and alternatives for the proposed anesthesia with the patient or authorized representative who has indicated his/her understanding and acceptance.       Plan Discussed with: CRNA  Anesthesia Plan Comments:         Anesthesia Quick Evaluation

## 2021-09-06 NOTE — Anesthesia Procedure Notes (Signed)
Procedure Name: MAC Date/Time: 09/06/2021 8:05 AM Performed by: Dionne Bucy, CRNA Pre-anesthesia Checklist: Patient identified, Emergency Drugs available, Suction available, Patient being monitored and Timeout performed Patient Re-evaluated:Patient Re-evaluated prior to induction Oxygen Delivery Method: Nasal cannula Placement Confirmation: positive ETCO2

## 2021-09-06 NOTE — Transfer of Care (Signed)
Immediate Anesthesia Transfer of Care Note  Patient: Brandon Galvan  Procedure(s) Performed: CATARACT EXTRACTION PHACO AND INTRAOCULAR LENS PLACEMENT (IOC) LEFT (Left: Eye)  Patient Location: PACU  Anesthesia Type: MAC  Level of Consciousness: awake, alert  and patient cooperative  Airway and Oxygen Therapy: Patient Spontanous Breathing and Patient connected to supplemental oxygen  Post-op Assessment: Post-op Vital signs reviewed, Patient's Cardiovascular Status Stable, Respiratory Function Stable, Patent Airway and No signs of Nausea or vomiting  Post-op Vital Signs: Reviewed and stable  Complications: No notable events documented.

## 2021-09-07 ENCOUNTER — Encounter: Payer: Self-pay | Admitting: Ophthalmology

## 2021-09-08 ENCOUNTER — Encounter: Payer: Self-pay | Admitting: Urology

## 2021-09-08 ENCOUNTER — Ambulatory Visit: Payer: Managed Care, Other (non HMO) | Admitting: Urology

## 2021-09-08 ENCOUNTER — Other Ambulatory Visit: Payer: Self-pay

## 2021-09-08 VITALS — BP 142/85 | HR 73 | Ht 67.0 in | Wt 187.0 lb

## 2021-09-08 DIAGNOSIS — Z125 Encounter for screening for malignant neoplasm of prostate: Secondary | ICD-10-CM

## 2021-09-08 DIAGNOSIS — N529 Male erectile dysfunction, unspecified: Secondary | ICD-10-CM | POA: Diagnosis not present

## 2021-09-08 MED ORDER — TADALAFIL 10 MG PO TABS: 10.0000 mg | ORAL_TABLET | Freq: Every day | ORAL | 11 refills | Status: DC | PRN

## 2021-09-08 NOTE — Progress Notes (Signed)
° °  09/08/2021 2:17 PM   Brandon Galvan March 29, 1956 [id number]  Reason for visit: Follow up PSA screening, ED, history of UTI  HPI: He is a healthy 65 year old male with no family history of prostate cancer who has a history of 2 mildly elevated PSAs of 4.27 and 4.25 in June and August 2020, as well as 2 UTIs around that time.  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  He opted for repeat PSA in December 2020 which dropped back down to normal at 2.9, and he opted for yearly screening.  DRE has been benign.  He denies any urinary symptoms.  PSA this year remains stable and normal at 2.6 from 09/03/2021.  We again reviewed the AUA guidelines that recommend PSA screening every 1 to 2 years up until age 53.  In terms of his ED, he is taking the Cialis 10 to 20 mg on demand with good results.  Cialis refilled RTC 1 year PSA prior, PVR   [name], MD  [facility] [address] [place], [place] [address] [phone]

## 2021-09-08 NOTE — Patient Instructions (Signed)
Prostate Cancer Screening ?Prostate cancer screening is testing that is done to check for the presence of prostate cancer in men. The prostate gland is a walnut-sized gland that is located below the bladder and in front of the rectum in males. The function of the prostate is to add fluid to semen during ejaculation. Prostate cancer is one of the most common types of cancer in men. ?Who should have prostate cancer screening? ?Screening recommendations vary based on age and other risk factors, as well as between the professional organizations who make the recommendations. ?In general, screening is recommended if: ?You are age 50 to 70 and have an average risk for prostate cancer. You should talk with your health care provider about your need for screening and how often screening should be done. Because most prostate cancers are slow growing and will not cause death, screening in this age group is generally reserved for men who have a 10- to 15-year life expectancy. ?You are younger than age 50, and you have these risk factors: ?Having a father, brother, or uncle who has been diagnosed with prostate cancer. The risk is higher if your family member's cancer occurred at an early age or if you have multiple family members with prostate cancer at an early age. ?Being a male who is Black or is of Caribbean or sub-Saharan African descent. ?In general, screening is not recommended if: ?You are younger than age 40. ?You are between the ages of 40 and 49 and you have no risk factors. ?You are 70 years of age or older. At this age, the risks that screening can cause are greater than the benefits that it may provide. ?If you are at high risk for prostate cancer, your health care provider may recommend that you have screenings more often or that you start screening at a younger age. ?How is screening for prostate cancer done? ?The recommended prostate cancer screening test is a blood test called the prostate-specific antigen (PSA)  test. PSA is a protein that is made in the prostate. As you age, your prostate naturally produces more PSA. Abnormally high PSA levels may be caused by: ?Prostate cancer. ?An enlarged prostate that is not caused by cancer (benign prostatic hyperplasia, or BPH). This condition is very common in older men. ?A prostate gland infection (prostatitis) or urinary tract infection. ?Certain medicines such as male hormones (like testosterone) or other medicines that raise testosterone levels. ?A rectal exam may be done as part of prostate cancer screening to help provide information about the size of your prostate gland. When a rectal exam is performed, it should be done after the PSA level is drawn to avoid any effect on the results. ?Depending on the PSA results, you may need more tests, such as: ?A physical exam to check the size of your prostate gland, if not done as part of screening. ?Blood and imaging tests. ?A procedure to remove tissue samples from your prostate gland for testing (biopsy). This is the only way to know for certain if you have prostate cancer. ?What are the benefits of prostate cancer screening? ?Screening can help to identify cancer at an early stage, before symptoms start and when the cancer can be treated more easily. ?There is a small chance that screening may lower your risk of dying from prostate cancer. The chance is small because prostate cancer is a slow-growing cancer, and most men with prostate cancer die from a different cause. ?What are the risks of prostate cancer screening? ?The main   risk of prostate cancer screening is diagnosing and treating prostate cancer that would never have caused any symptoms or problems. This is called overdiagnosisand overtreatment. PSA screening cannot tell you if your PSA is high due to cancer or a different cause. A prostate biopsy is the only procedure to diagnose prostate cancer. Even the results of a biopsy may not tell you if your cancer needs to be  treated. Slow-growing prostate cancer may not need any treatment other than monitoring, so diagnosing and treating it may cause unnecessary stress or other side effects. ?Questions to ask your health care provider ?When should I start prostate cancer screening? ?What is my risk for prostate cancer? ?How often do I need screening? ?What type of screening tests do I need? ?How do I get my test results? ?What do my results mean? ?Do I need treatment? ?Where to find more information ?The American Cancer Society: www.cancer.org ?American Urological Association: www.auanet.org ?Contact a health care provider if: ?You have difficulty urinating. ?You have pain when you urinate or ejaculate. ?You have blood in your urine or semen. ?You have pain in your back or in the area of your prostate. ?Summary ?Prostate cancer is a common type of cancer in men. The prostate gland is located below the bladder and in front of the rectum. This gland adds fluid to semen during ejaculation. ?Prostate cancer screening may identify cancer at an early stage, when the cancer can be treated more easily and is less likely to have spread to other areas of the body. ?The prostate-specific antigen (PSA) test is the recommended screening test for prostate cancer, but it has associated risks. ?Discuss the risks and benefits of prostate cancer screening with your health care provider. If you are age 70 or older, the risks that screening can cause are greater than the benefits that it may provide. ?This information is not intended to replace advice given to you by your health care provider. Make sure you discuss any questions you have with your health care provider. ?Document Revised: 03/01/2021 Document Reviewed: 03/01/2021 ?Elsevier Patient Education ? 2022 Elsevier Inc. ? ?

## 2021-10-04 ENCOUNTER — Other Ambulatory Visit: Payer: Managed Care, Other (non HMO)

## 2021-10-08 ENCOUNTER — Other Ambulatory Visit: Payer: Self-pay

## 2021-10-08 ENCOUNTER — Encounter
Admission: RE | Admit: 2021-10-08 | Discharge: 2021-10-08 | Disposition: A | Discharge status: Home / Self Care | Payer: Managed Care, Other (non HMO) | Source: Ambulatory Visit | Attending: Orthopedic Surgery | Admitting: Orthopedic Surgery

## 2021-10-08 VITALS — BP 148/88 | HR 68 | Temp 97.6°F | Resp 16 | Ht 67.0 in | Wt 190.0 lb

## 2021-10-08 DIAGNOSIS — Z01812 Encounter for preprocedural laboratory examination: Secondary | ICD-10-CM

## 2021-10-08 DIAGNOSIS — Z01818 Encounter for other preprocedural examination: Secondary | ICD-10-CM | POA: Insufficient documentation

## 2021-10-08 LAB — COMPREHENSIVE METABOLIC PANEL
ALT: 21 U/L (ref 0–44)
AST: 20 U/L (ref 15–41)
Albumin: 4.4 g/dL (ref 3.5–5.0)
Alkaline Phosphatase: 68 U/L (ref 38–126)
Anion gap: 8 (ref 5–15)
BUN: 24 mg/dL — ABNORMAL HIGH (ref 8–23)
CO2: 26 mmol/L (ref 22–32)
Calcium: 9.2 mg/dL (ref 8.9–10.3)
Chloride: 103 mmol/L (ref 98–111)
Creatinine, Ser: 0.82 mg/dL (ref 0.61–1.24)
GFR, Estimated: >60 mL/min (ref >60)
Glucose, Bld: 89 mg/dL (ref 70–99)
Potassium: 4.5 mmol/L (ref 3.5–5.1)
Sodium: 137 mmol/L (ref 135–145)
Total Bilirubin: 0.9 mg/dL (ref 0.3–1.2)
Total Protein: 7.5 g/dL (ref 6.5–8.1)

## 2021-10-08 LAB — CBC WITH DIFFERENTIAL/PLATELET
Abs Immature Granulocytes: 0.02 10*3/uL (ref 0.00–0.07)
Basophils Absolute: 0 10*3/uL (ref 0.0–0.1)
Basophils Relative: 1 %
Eosinophils Absolute: 0.4 10*3/uL (ref 0.0–0.5)
Eosinophils Relative: 7 %
HCT: 43.8 % (ref 39.0–52.0)
Hemoglobin: 15.2 g/dL (ref 13.0–17.0)
Immature Granulocytes: 0 %
Lymphocytes Relative: 28 %
Lymphs Abs: 1.5 10*3/uL (ref 0.7–4.0)
MCH: 32.7 pg (ref 26.0–34.0)
MCHC: 34.7 g/dL (ref 30.0–36.0)
MCV: 94.2 fL (ref 80.0–100.0)
Monocytes Absolute: 0.5 10*3/uL (ref 0.1–1.0)
Monocytes Relative: 10 %
Neutro Abs: 2.8 10*3/uL (ref 1.7–7.7)
Neutrophils Relative %: 54 %
Platelets: 272 10*3/uL (ref 150–400)
RBC: 4.65 MIL/uL (ref 4.22–5.81)
RDW: 13.1 % (ref 11.5–15.5)
WBC: 5.2 10*3/uL (ref 4.0–10.5)
nRBC: 0 % (ref 0.0–0.2)

## 2021-10-08 LAB — URINALYSIS, ROUTINE W REFLEX MICROSCOPIC
Bilirubin Urine: NEGATIVE
Glucose, UA: NEGATIVE mg/dL
Hgb urine dipstick: NEGATIVE
Ketones, ur: 5 mg/dL — AB
Leukocytes,Ua: NEGATIVE
Nitrite: NEGATIVE
Protein, ur: NEGATIVE mg/dL
Specific Gravity, Urine: 1.018 (ref 1.005–1.030)
pH: 5 (ref 5.0–8.0)

## 2021-10-08 LAB — SURGICAL PCR SCREEN
MRSA, PCR: NEGATIVE
Staphylococcus aureus: NEGATIVE

## 2021-10-08 NOTE — Progress Notes (Signed)
Perioperative Services Pre-Admission/Anesthesia Testing   Date: 10/08/21 Name: Brandon Galvan MRN:   [id number]  Re: Consideration of preoperative prophylactic antibiotic change   Request sent to: Hessie Knows, MD (routed and/or faxed via [facility])  Planned Surgical Procedure(s):    Case: [id number] Date/Time: 10/15/21 1215   Procedure: TOTAL HIP ARTHROPLASTY ANTERIOR APPROACH (Left: Hip)   Anesthesia type: Choice   Pre-op diagnosis: Primary osteoarthritis of left hip  M16.12   Location: [facility] OR ROOM 02 / [place] [facility] FOR ANESTHESIA [facility]   Surgeons: Hessie Knows, MD   Clinical Notes:  Patient has a documented allergy to PCN  Advising that PCN has caused him to experience urticarial rash, swelling, and anaphylactic symptoms in the past.   Received cephalosporin with no documented complications CEFUROXIME received on 09/21/2018 without documented ADRs.  Screened as appropriate for cephalosporin use during medication reconciliation No immediate angioedema, dysphagia, SOB, anaphylaxis symptoms. No severe rash involving mucous membranes or skin necrosis. No hospital admissions related to side effects of PCN/cephalosporin use.  No documented reaction to PCN or cephalosporin in the last 10 years.  Request:  As an evidence based approach to reducing the rate of incidence for post-operative SSI and the development of MDROs, could an agent with narrower coverage for preoperative prophylaxis in this patient's upcoming surgical course be considered?   Currently ordered preoperative prophylactic ABX: clindamycin.   Specifically requesting change to cephalosporin (CEFAZOLIN).   Please communicate decision with me and I will change the orders in Epic as per your direction.   Things to consider: Many patients report that they were "allergic" to PCN earlier in life, however this does not translate into a true lifelong allergy. Patients can lose sensitivity to specific IgE antibodies over time if  PCN is avoided (Kleris & Lugar, 2019).  Up to 10% of the adult population and 15% of hospitalized patients report an allergy to PCN, however clinical studies suggest that 90% of those reporting an allergy can tolerate PCN antibiotics (Kleris & Lugar, 2019).  Cross-sensitivity between PCN and cephalosporins has been documented as being as high as 10%, however this estimation included data believed to have been collected in a setting where there was contamination. Newer data suggests that the prevalence of cross-sensitivity between PCN and cephalosporins is actually estimated to be closer to 1% (Hermanides et al., 2018).   Patients labeled as PCN allergic, whether they are truly allergic or not, have been found to have inferior outcomes in terms of rates of serious infection, and these patients tend to have longer hospital stays ([place], 2019).  Treatment related secondary infections, such as Clostridioides difficile, have been linked to the improper use of broad spectrum antibiotics in patients improperly labeled as PCN allergic (Kleris & Lugar, 2019).  Anaphylaxis from cephalosporins is rare and the evidence suggests that there is no increased risk of an anaphylactic type reaction when cephalosporins are used in a PCN allergic patient (Pichichero, 2006).  Citations: Hermanides J, Lemkes BA, Prins Pearla Dubonnet MW, Terreehorst I. Presumed ?-Lactam Allergy and Cross-reactivity in the Operating Theater: A Practical Approach. Anesthesiology. 2018 Aug;129(2):335-342. doi: 10.1097/ALN.0000000000002252. PMID: 46270350.  Kleris, Hamilton., & Lugar, P. L. (2019). Things We Do For No Reason: Failing to Question a Penicillin Allergy History. Journal of hospital medicine, 14(10), 6692728704. Advance online publication. https://www.wallace-middleton.info/  Pichichero, M. E. (2006). Cephalosporins can be prescribed safely for penicillin-allergic patients. Journal of family medicine, 55(2), 106-112. Accessed:  https://cdn.mdedge.com/files/s74fs-public/Document/September-2017/5502JFP_AppliedEvidence1.pdf   Honor Loh, MSN, APRN, FNP-C, [place]  [facility]  Peri-operative Services Nurse Practitioner FAX: [phone] 10/08/21 11:17 AM

## 2021-10-08 NOTE — Patient Instructions (Addendum)
Your procedure is scheduled on: Friday October 15, 2021. Report to Day Surgery inside [place] 2nd floor.  To find out your arrival time please call [phone] between 1PM - 3PM on Thursday October 14, 2021.  Remember: Instructions that are not followed completely may result in serious medical risk,  up to and including death, or upon the discretion of your surgeon and anesthesiologist your  surgery may need to be rescheduled.     _X__ 1. Do not eat food after midnight the night before your procedure.                 No chewing gum or hard candies. You may drink clear liquids up to 2 hours                 before you are scheduled to arrive for your surgery- DO not drink clear                 liquids within 2 hours of the start of your surgery.                 Clear Liquids include:  water, apple juice without pulp, clear Gatorade, G2 or                  Gatorade Zero (avoid Red/Purple/Blue), Black Coffee or Tea (Do not add                 anything to coffee or tea).  __X__2.   Complete the "Ensure Clear Pre-surgery Clear Carbohydrate Drink" provided to you, 2 hours before arrival. **If you are diabetic you will be provided with an alternative drink, Gatorade Zero or G2.  __X__3.  On the morning of surgery brush your teeth with toothpaste and water, you                may rinse your mouth with mouthwash if you wish.  Do not swallow any toothpaste of mouthwash.     _X__ 4.  No Alcohol for 24 hours before or after surgery.   _X__ 5.  Do Not Smoke or use e-cigarettes For 24 Hours Prior to Your Surgery.                 Do not use any chewable tobacco products for at least 6 hours prior to                 Surgery.  _X__  6.  Do not use any recreational drugs (marijuana, cocaine, heroin, ecstasy, MDMA or other)                For at least one week prior to your surgery.  Combination of these drugs with anesthesia                May have life threatening  results.  ____ 7.  Bring all medications with you on the day of surgery if instructed.   __X__8.  Notify your doctor if there is any change in your medical condition      (cold, fever, infections).     Do not wear jewelry, make-up, hairpins, clips or nail polish. Do not wear lotions, powders, or perfumes. You may wear deodorant. Do not shave 48 hours prior to surgery. Men may shave face and neck. Do not bring valuables to the hospital.    [facility] is not responsible for any belongings or valuables.  Contacts, dentures or bridgework may not be worn into surgery.  Leave your suitcase in the car. After surgery it may be brought to your room. For patients admitted to the hospital, discharge time is determined by your treatment team.   Patients discharged the day of surgery will not be allowed to drive home.   Make arrangements for someone to be with you for the first 24 hours of your Same Day Discharge.   __X__ Take these medicines the morning of surgery with A SIP OF WATER:    1. None  2.   3.   4.  5.  6.  ____ Fleet Enema (as directed)   __X__ Use CHG Soap (or wipes) as directed  ____ Use Benzoyl Peroxide Gel as instructed  __X__ Use your eyedrops on the day of surgery  brimonidine-timolol (COMBIGAN) 0.2-0.5 % ophthalmic solution  ____ Stop metformin 2 days prior to surgery    ____ Take 1/2 of usual insulin dose the night before surgery. No insulin the morning          of surgery.   ____ Call your PCP, cardiologist, or Pulmonologist if taking Coumadin/Plavix/aspirin and ask when to stop before your surgery.   __X__ One Week prior to surgery- Stop Anti-inflammatories such as Ibuprofen, Aleve, Advil, Motrin, meloxicam (MOBIC), diclofenac, etodolac, ketorolac, Toradol, Daypro, piroxicam, Goody's or BC powders. OK TO USE TYLENOL IF NEEDED   __X__ Stop supplements until after surgery.    ____ Bring C-Pap to the hospital.    If you have any questions regarding your  pre-procedure instructions,  Please call Pre-admit Testing at [phone]

## 2021-10-13 ENCOUNTER — Other Ambulatory Visit
Admission: RE | Admit: 2021-10-13 | Discharge: 2021-10-13 | Disposition: A | Discharge status: Home / Self Care | Payer: Managed Care, Other (non HMO) | Source: Ambulatory Visit | Attending: Orthopedic Surgery | Admitting: Orthopedic Surgery

## 2021-10-13 ENCOUNTER — Other Ambulatory Visit: Payer: Self-pay

## 2021-10-13 DIAGNOSIS — Z20822 Contact with and (suspected) exposure to covid-19: Secondary | ICD-10-CM | POA: Diagnosis not present

## 2021-10-13 DIAGNOSIS — Z01812 Encounter for preprocedural laboratory examination: Secondary | ICD-10-CM | POA: Diagnosis present

## 2021-10-14 LAB — TYPE AND SCREEN
ABO/RH(D): A POS
Antibody Screen: NEGATIVE

## 2021-10-14 LAB — SARS CORONAVIRUS 2 (TAT 6-24 HRS): SARS Coronavirus 2: NEGATIVE

## 2021-10-15 ENCOUNTER — Observation Stay
Admission: RE | Admit: 2021-10-15 | Discharge: 2021-10-18 | Disposition: A | Discharge status: Home / Self Care | Payer: Managed Care, Other (non HMO) | Attending: Orthopedic Surgery | Admitting: Orthopedic Surgery

## 2021-10-15 ENCOUNTER — Ambulatory Visit: Payer: Managed Care, Other (non HMO) | Admitting: Urgent Care

## 2021-10-15 ENCOUNTER — Ambulatory Visit: Payer: Managed Care, Other (non HMO)

## 2021-10-15 ENCOUNTER — Encounter
Admission: RE | Disposition: A | Discharge status: Home / Self Care | Payer: Self-pay | Source: Home / Self Care | Attending: Orthopedic Surgery

## 2021-10-15 ENCOUNTER — Other Ambulatory Visit: Payer: Self-pay

## 2021-10-15 ENCOUNTER — Observation Stay: Payer: Managed Care, Other (non HMO)

## 2021-10-15 ENCOUNTER — Encounter: Payer: Self-pay | Admitting: Orthopedic Surgery

## 2021-10-15 DIAGNOSIS — M1612 Unilateral primary osteoarthritis, left hip: Secondary | ICD-10-CM | POA: Diagnosis not present

## 2021-10-15 DIAGNOSIS — Z96642 Presence of left artificial hip joint: Secondary | ICD-10-CM

## 2021-10-15 DIAGNOSIS — Z96641 Presence of right artificial hip joint: Secondary | ICD-10-CM | POA: Insufficient documentation

## 2021-10-15 DIAGNOSIS — Z419 Encounter for procedure for purposes other than remedying health state, unspecified: Secondary | ICD-10-CM

## 2021-10-15 DIAGNOSIS — G8918 Other acute postprocedural pain: Secondary | ICD-10-CM

## 2021-10-15 HISTORY — PX: TOTAL HIP ARTHROPLASTY: SHX124

## 2021-10-15 LAB — CREATININE, SERUM
Creatinine, Ser: 0.8 mg/dL (ref 0.61–1.24)
GFR, Estimated: >60 mL/min (ref >60)

## 2021-10-15 LAB — CBC
HCT: 40.1 % (ref 39.0–52.0)
Hemoglobin: 13.8 g/dL (ref 13.0–17.0)
MCH: 32.6 pg (ref 26.0–34.0)
MCHC: 34.4 g/dL (ref 30.0–36.0)
MCV: 94.8 fL (ref 80.0–100.0)
Platelets: 268 10*3/uL (ref 150–400)
RBC: 4.23 MIL/uL (ref 4.22–5.81)
RDW: 13.3 % (ref 11.5–15.5)
WBC: 13 10*3/uL — ABNORMAL HIGH (ref 4.0–10.5)
nRBC: 0 % (ref 0.0–0.2)

## 2021-10-15 SURGERY — ARTHROPLASTY, HIP, TOTAL, ANTERIOR APPROACH
Anesthesia: Spinal | Site: Hip | Laterality: Left

## 2021-10-15 MED ORDER — ORAL CARE MOUTH RINSE: 15.0000 mL | Freq: Once | OROMUCOSAL | Status: AC

## 2021-10-15 MED ORDER — BRIMONIDINE TARTRATE 0.2 % OP SOLN
1.0000 [drp] | Freq: Two times a day (BID) | OPHTHALMIC | Status: DC
  Administered 2021-10-16 – 2021-10-18 (×4): 1 [drp] via OPHTHALMIC
  Filled 2021-10-15: qty 5

## 2021-10-15 MED ORDER — CLINDAMYCIN PHOSPHATE 900 MG/50ML IV SOLN
INTRAVENOUS | Status: AC
  Filled 2021-10-15: qty 50

## 2021-10-15 MED ORDER — FAMOTIDINE 20 MG PO TABS: 20.0000 mg | ORAL_TABLET | Freq: Once | ORAL | Status: AC

## 2021-10-15 MED ORDER — BRIMONIDINE TARTRATE-TIMOLOL 0.2-0.5 % OP SOLN
1.0000 [drp] | Freq: Two times a day (BID) | OPHTHALMIC | Status: DC
  Filled 2021-10-15: qty 5

## 2021-10-15 MED ORDER — LACTATED RINGERS IV SOLN: INTRAVENOUS | Status: DC

## 2021-10-15 MED ORDER — MIDAZOLAM HCL 5 MG/5ML IJ SOLN
INTRAMUSCULAR | Status: DC | PRN
  Administered 2021-10-15: 2 mg via INTRAVENOUS

## 2021-10-15 MED ORDER — LIDOCAINE HCL (PF) 2 % IJ SOLN
INTRAMUSCULAR | Status: AC
  Filled 2021-10-15: qty 5

## 2021-10-15 MED ORDER — SODIUM CHLORIDE FLUSH 0.9 % IV SOLN
INTRAVENOUS | Status: AC
  Filled 2021-10-15: qty 80

## 2021-10-15 MED ORDER — GLYCOPYRROLATE 0.2 MG/ML IJ SOLN
INTRAMUSCULAR | Status: AC
  Filled 2021-10-15: qty 1

## 2021-10-15 MED ORDER — SODIUM CHLORIDE 0.9 % IR SOLN: Status: DC | PRN

## 2021-10-15 MED ORDER — HYDROCODONE-ACETAMINOPHEN 5-325 MG PO TABS
1.0000 | ORAL_TABLET | ORAL | Status: DC | PRN
  Administered 2021-10-17 – 2021-10-18 (×3): 2 via ORAL
  Filled 2021-10-15 (×3): qty 2

## 2021-10-15 MED ORDER — CHLORHEXIDINE GLUCONATE 0.12 % MT SOLN: 15.0000 mL | Freq: Once | OROMUCOSAL | Status: AC

## 2021-10-15 MED ORDER — BUPIVACAINE HCL (PF) 0.5 % IJ SOLN
INTRAMUSCULAR | Status: DC | PRN
  Administered 2021-10-15: 3 mL via INTRATHECAL

## 2021-10-15 MED ORDER — PHENOL 1.4 % MT LIQD
1.0000 | OROMUCOSAL | Status: DC | PRN
  Filled 2021-10-15: qty 177

## 2021-10-15 MED ORDER — FENTANYL CITRATE (PF) 100 MCG/2ML IJ SOLN
25.0000 ug | INTRAMUSCULAR | Status: DC | PRN
  Administered 2021-10-15: 50 ug via INTRAVENOUS

## 2021-10-15 MED ORDER — BUPIVACAINE LIPOSOME 1.3 % IJ SUSP
INTRAMUSCULAR | Status: AC
  Filled 2021-10-15: qty 40

## 2021-10-15 MED ORDER — SODIUM CHLORIDE 0.9 % IV SOLN: INTRAVENOUS | Status: DC

## 2021-10-15 MED ORDER — PANTOPRAZOLE SODIUM 40 MG PO TBEC
40.0000 mg | DELAYED_RELEASE_TABLET | Freq: Every day | ORAL | Status: DC
  Administered 2021-10-15 – 2021-10-18 (×4): 40 mg via ORAL
  Filled 2021-10-15 (×4): qty 1

## 2021-10-15 MED ORDER — ENOXAPARIN SODIUM 40 MG/0.4ML IJ SOSY
40.0000 mg | PREFILLED_SYRINGE | INTRAMUSCULAR | Status: DC
  Administered 2021-10-16 – 2021-10-18 (×3): 40 mg via SUBCUTANEOUS
  Filled 2021-10-15 (×3): qty 0.4

## 2021-10-15 MED ORDER — EPHEDRINE SULFATE (PRESSORS) 50 MG/ML IJ SOLN
INTRAMUSCULAR | Status: DC | PRN
  Administered 2021-10-15: 5 mg via INTRAVENOUS
  Administered 2021-10-15: 2.5 mg via INTRAVENOUS

## 2021-10-15 MED ORDER — FENTANYL CITRATE (PF) 100 MCG/2ML IJ SOLN
INTRAMUSCULAR | Status: AC
  Administered 2021-10-15: 50 ug via INTRAVENOUS
  Filled 2021-10-15: qty 2

## 2021-10-15 MED ORDER — METHOCARBAMOL 500 MG PO TABS
500.0000 mg | ORAL_TABLET | Freq: Four times a day (QID) | ORAL | Status: DC | PRN
  Administered 2021-10-15 – 2021-10-18 (×5): 500 mg via ORAL
  Filled 2021-10-15 (×5): qty 1

## 2021-10-15 MED ORDER — DOCUSATE SODIUM 100 MG PO CAPS
100.0000 mg | ORAL_CAPSULE | Freq: Two times a day (BID) | ORAL | Status: DC
  Administered 2021-10-15 – 2021-10-18 (×6): 100 mg via ORAL
  Filled 2021-10-15 (×6): qty 1

## 2021-10-15 MED ORDER — BUPIVACAINE HCL (PF) 0.25 % IJ SOLN
INTRAMUSCULAR | Status: AC
  Filled 2021-10-15: qty 60

## 2021-10-15 MED ORDER — ACETAMINOPHEN 325 MG PO TABS: 325.0000 mg | ORAL_TABLET | Freq: Four times a day (QID) | ORAL | Status: DC | PRN

## 2021-10-15 MED ORDER — HEMOSTATIC AGENTS (NO CHARGE) OPTIME
TOPICAL | Status: DC | PRN
Start: 2021-10-15 — End: 2021-10-15
  Administered 2021-10-15: 2 via TOPICAL

## 2021-10-15 MED ORDER — ONDANSETRON HCL 4 MG/2ML IJ SOLN: 4.0000 mg | Freq: Four times a day (QID) | INTRAMUSCULAR | Status: DC | PRN

## 2021-10-15 MED ORDER — TIMOLOL MALEATE 0.5 % OP SOLN
1.0000 [drp] | Freq: Two times a day (BID) | OPHTHALMIC | Status: DC
  Administered 2021-10-16 – 2021-10-17 (×2): 1 [drp] via OPHTHALMIC
  Filled 2021-10-15: qty 5

## 2021-10-15 MED ORDER — MORPHINE SULFATE (PF) 2 MG/ML IV SOLN: 0.5000 mg | INTRAVENOUS | Status: DC | PRN

## 2021-10-15 MED ORDER — METOCLOPRAMIDE HCL 10 MG PO TABS: 5.0000 mg | ORAL_TABLET | Freq: Three times a day (TID) | ORAL | Status: DC | PRN

## 2021-10-15 MED ORDER — PROPOFOL 500 MG/50ML IV EMUL
INTRAVENOUS | Status: DC | PRN
  Administered 2021-10-15: 100 ug/kg/min via INTRAVENOUS

## 2021-10-15 MED ORDER — POLYETHYLENE GLYCOL 3350 17 G PO PACK
17.0000 g | PACK | Freq: Every day | ORAL | Status: DC | PRN
  Administered 2021-10-16 – 2021-10-17 (×2): 17 g via ORAL
  Filled 2021-10-15 (×2): qty 1

## 2021-10-15 MED ORDER — ALUM & MAG HYDROXIDE-SIMETH 200-200-20 MG/5ML PO SUSP: 30.0000 mL | ORAL | Status: DC | PRN

## 2021-10-15 MED ORDER — MENTHOL 3 MG MT LOZG
1.0000 | LOZENGE | OROMUCOSAL | Status: DC | PRN
  Filled 2021-10-15: qty 9

## 2021-10-15 MED ORDER — ONDANSETRON HCL 4 MG/2ML IJ SOLN
INTRAMUSCULAR | Status: AC
  Filled 2021-10-15: qty 8

## 2021-10-15 MED ORDER — FLEET ENEMA 7-19 GM/118ML RE ENEM: 1.0000 | ENEMA | Freq: Once | RECTAL | Status: DC | PRN

## 2021-10-15 MED ORDER — METHOCARBAMOL 1000 MG/10ML IJ SOLN
500.0000 mg | Freq: Four times a day (QID) | INTRAVENOUS | Status: DC | PRN
  Filled 2021-10-15: qty 5

## 2021-10-15 MED ORDER — ONDANSETRON HCL 4 MG/2ML IJ SOLN
INTRAMUSCULAR | Status: DC | PRN
  Administered 2021-10-15: 4 mg via INTRAVENOUS

## 2021-10-15 MED ORDER — GLYCOPYRROLATE 0.2 MG/ML IJ SOLN
INTRAMUSCULAR | Status: DC | PRN
  Administered 2021-10-15: .2 mg via INTRAVENOUS

## 2021-10-15 MED ORDER — ACETAMINOPHEN 10 MG/ML IV SOLN
INTRAVENOUS | Status: DC | PRN
  Administered 2021-10-15: 1000 mg via INTRAVENOUS

## 2021-10-15 MED ORDER — CHLORHEXIDINE GLUCONATE 0.12 % MT SOLN
OROMUCOSAL | Status: AC
  Administered 2021-10-15: 15 mL via OROMUCOSAL
  Filled 2021-10-15: qty 15

## 2021-10-15 MED ORDER — PROPOFOL 10 MG/ML IV BOLUS
INTRAVENOUS | Status: DC | PRN
  Administered 2021-10-15: 30 mg via INTRAVENOUS

## 2021-10-15 MED ORDER — PHENYLEPHRINE HCL-NACL 20-0.9 MG/250ML-% IV SOLN
INTRAVENOUS | Status: DC | PRN
Start: 2021-10-15 — End: 2021-10-15
  Administered 2021-10-15: 40 ug/min via INTRAVENOUS

## 2021-10-15 MED ORDER — ACETAMINOPHEN 10 MG/ML IV SOLN
INTRAVENOUS | Status: AC
  Filled 2021-10-15: qty 100

## 2021-10-15 MED ORDER — ONDANSETRON HCL 4 MG PO TABS
4.0000 mg | ORAL_TABLET | Freq: Four times a day (QID) | ORAL | Status: DC | PRN
  Administered 2021-10-16: 4 mg via ORAL
  Filled 2021-10-15: qty 1

## 2021-10-15 MED ORDER — TRAMADOL HCL 50 MG PO TABS
50.0000 mg | ORAL_TABLET | Freq: Four times a day (QID) | ORAL | Status: DC
  Administered 2021-10-15 – 2021-10-18 (×10): 50 mg via ORAL
  Filled 2021-10-15 (×10): qty 1

## 2021-10-15 MED ORDER — SODIUM CHLORIDE (PF) 0.9 % IJ SOLN
INTRAMUSCULAR | Status: DC | PRN
  Administered 2021-10-15: 90 mL via INTRAMUSCULAR

## 2021-10-15 MED ORDER — MIDAZOLAM HCL 2 MG/2ML IJ SOLN
INTRAMUSCULAR | Status: AC
  Filled 2021-10-15: qty 2

## 2021-10-15 MED ORDER — METOCLOPRAMIDE HCL 5 MG/ML IJ SOLN: 5.0000 mg | Freq: Three times a day (TID) | INTRAMUSCULAR | Status: DC | PRN

## 2021-10-15 MED ORDER — NEOMYCIN-POLYMYXIN B GU 40-200000 IR SOLN
Status: AC
  Filled 2021-10-15: qty 8

## 2021-10-15 MED ORDER — CLINDAMYCIN PHOSPHATE 900 MG/50ML IV SOLN
900.0000 mg | Freq: Four times a day (QID) | INTRAVENOUS | Status: AC
  Administered 2021-10-15 (×2): 900 mg via INTRAVENOUS
  Filled 2021-10-15 (×2): qty 50

## 2021-10-15 MED ORDER — ZOLPIDEM TARTRATE 5 MG PO TABS: 5.0000 mg | ORAL_TABLET | Freq: Every evening | ORAL | Status: DC | PRN

## 2021-10-15 MED ORDER — BISACODYL 10 MG RE SUPP
10.0000 mg | Freq: Every day | RECTAL | Status: DC | PRN
  Administered 2021-10-17: 10 mg via RECTAL
  Filled 2021-10-15: qty 1

## 2021-10-15 MED ORDER — HYDROCODONE-ACETAMINOPHEN 7.5-325 MG PO TABS
1.0000 | ORAL_TABLET | ORAL | Status: DC | PRN
  Administered 2021-10-15 – 2021-10-16 (×3): 2 via ORAL
  Administered 2021-10-17: 1 via ORAL
  Filled 2021-10-15 (×4): qty 2
  Filled 2021-10-15: qty 1

## 2021-10-15 MED ORDER — FAMOTIDINE 20 MG PO TABS
ORAL_TABLET | ORAL | Status: AC
  Administered 2021-10-15: 20 mg via ORAL
  Filled 2021-10-15: qty 1

## 2021-10-15 MED ORDER — CLINDAMYCIN PHOSPHATE 900 MG/50ML IV SOLN
900.0000 mg | INTRAVENOUS | Status: AC
  Administered 2021-10-15: 900 mg via INTRAVENOUS

## 2021-10-15 SURGICAL SUPPLY — 62 items
BLADE CLIPPER SURG (BLADE) ×1 IMPLANT
BLADE SAGITTAL AGGR TOOTH XLG (BLADE) ×2 IMPLANT
BNDG COHESIVE 6X5 TAN ST LF (GAUZE/BANDAGES/DRESSINGS) ×6 IMPLANT
CANISTER WOUND CARE 500ML ATS (WOUND CARE) ×2 IMPLANT
CHLORAPREP W/TINT 26 (MISCELLANEOUS) ×2 IMPLANT
COVER BACK TABLE REUSABLE LG (DRAPES) ×2 IMPLANT
DRAPE 3/4 80X56 (DRAPES) ×6 IMPLANT
DRAPE C-ARM XRAY 36X54 (DRAPES) ×2 IMPLANT
DRAPE INCISE IOBAN 66X60 STRL (DRAPES) IMPLANT
DRAPE POUCH INSTRU U-SHP 10X18 (DRAPES) ×2 IMPLANT
DRESSING SURGICEL FIBRLLR 1X2 (HEMOSTASIS) ×2 IMPLANT
DRSG MEPILEX SACRM 8.7X9.8 (GAUZE/BANDAGES/DRESSINGS) ×2 IMPLANT
DRSG OPSITE POSTOP 4X8 (GAUZE/BANDAGES/DRESSINGS) ×3 IMPLANT
DRSG SURGICEL FIBRILLAR 1X2 (HEMOSTASIS) ×4
ELECT BLADE 6.5 EXT (BLADE) ×2 IMPLANT
ELECT REM PT RETURN 9FT ADLT (ELECTROSURGICAL) ×2
ELECTRODE REM PT RTRN 9FT ADLT (ELECTROSURGICAL) ×1 IMPLANT
GLOVE SURG SYN 9.0  PF PI (GLOVE) ×2
GLOVE SURG SYN 9.0 PF PI (GLOVE) ×2 IMPLANT
GLOVE SURG UNDER POLY LF SZ9 (GLOVE) ×2 IMPLANT
GOWN SRG 2XL LVL 4 RGLN SLV (GOWNS) ×1 IMPLANT
GOWN STRL NON-REIN 2XL LVL4 (GOWNS) ×1
GOWN STRL REUS W/ TWL LRG LVL3 (GOWN DISPOSABLE) ×1 IMPLANT
GOWN STRL REUS W/TWL LRG LVL3 (GOWN DISPOSABLE) ×1
HEAD FEMORAL 28MM SZ S (Head) ×1 IMPLANT
HEMOVAC 400CC 10FR (MISCELLANEOUS) IMPLANT
HIP DBL LINER 54X28 (Liner) ×1 IMPLANT
HOLDER FOLEY CATH W/STRAP (MISCELLANEOUS) ×2 IMPLANT
KIT PREVENA INCISION MGT 13 (CANNISTER) ×2 IMPLANT
MANIFOLD NEPTUNE II (INSTRUMENTS) ×2 IMPLANT
MAT ABSORB  FLUID 56X50 GRAY (MISCELLANEOUS) ×1
MAT ABSORB FLUID 56X50 GRAY (MISCELLANEOUS) ×1 IMPLANT
NDL SAFETY ECLIPSE 18X1.5 (NEEDLE) ×1 IMPLANT
NDL SPNL 20GX3.5 QUINCKE YW (NEEDLE) ×2 IMPLANT
NEEDLE HYPO 18GX1.5 SHARP (NEEDLE) ×1
NEEDLE SPNL 20GX3.5 QUINCKE YW (NEEDLE) ×4 IMPLANT
NS IRRIG 1000ML POUR BTL (IV SOLUTION) ×2 IMPLANT
PACK HIP COMPR (MISCELLANEOUS) ×2 IMPLANT
SCALPEL PROTECTED #10 DISP (BLADE) ×4 IMPLANT
SHELL ACETABULAR SZ 54 DM (Shell) ×1 IMPLANT
SOL PREP PVP 2OZ (MISCELLANEOUS)
SOLUTION PREP PVP 2OZ (MISCELLANEOUS) ×1 IMPLANT
SOLUTION PRONTOSAN WOUND 350ML (IRRIGATION / IRRIGATOR) IMPLANT
SPONGE DRAIN TRACH 4X4 STRL 2S (GAUZE/BANDAGES/DRESSINGS) ×1 IMPLANT
STAPLER SKIN PROX 35W (STAPLE) ×2 IMPLANT
STEM FEMORAL 4 STD COLLARED (Stem) ×1 IMPLANT
STRAP SAFETY 5IN WIDE (MISCELLANEOUS) ×2 IMPLANT
SUT DVC 2 QUILL PDO  T11 36X36 (SUTURE) ×1
SUT DVC 2 QUILL PDO T11 36X36 (SUTURE) ×1 IMPLANT
SUT SILK 0 (SUTURE)
SUT SILK 0 30XBRD TIE 6 (SUTURE) ×1 IMPLANT
SUT V-LOC 90 ABS DVC 3-0 CL (SUTURE) ×2 IMPLANT
SUT VIC AB 1 CT1 36 (SUTURE) ×2 IMPLANT
SYR 20ML LL LF (SYRINGE) ×2 IMPLANT
SYR 30ML LL (SYRINGE) ×2 IMPLANT
SYR 50ML LL SCALE MARK (SYRINGE) ×4 IMPLANT
SYR BULB IRRIG 60ML STRL (SYRINGE) ×2 IMPLANT
TAPE MICROFOAM 4IN (TAPE) ×1 IMPLANT
TOWEL OR 17X26 4PK STRL BLUE (TOWEL DISPOSABLE) ×1 IMPLANT
TRAY FOLEY MTR SLVR 16FR STAT (SET/KITS/TRAYS/PACK) ×2 IMPLANT
WATER STERILE IRR 1000ML POUR (IV SOLUTION) ×1 IMPLANT
WATER STERILE IRR 500ML POUR (IV SOLUTION) ×1 IMPLANT

## 2021-10-15 NOTE — Transfer of Care (Signed)
Immediate Anesthesia Transfer of Care Note  Patient: RODRIQUEZ THORNER  Procedure(s) Performed: TOTAL HIP ARTHROPLASTY ANTERIOR APPROACH (Left: Hip)  Patient Location: PACU  Anesthesia Type:Spinal  Level of Consciousness: drowsy  Airway & Oxygen Therapy: Patient Spontanous Breathing and Patient connected to face mask oxygen  Post-op Assessment: Report given to RN and Post -op Vital signs reviewed and stable  Post vital signs: Reviewed and stable  Last Vitals:  Vitals Value Taken Time  BP 151/82 10/15/21 1341  Temp    Pulse 52 10/15/21 1343  Resp 16 10/15/21 1343  SpO2 100 % 10/15/21 1343  Vitals shown include unvalidated device data.  Last Pain:  Vitals:   10/15/21 1121  TempSrc: Oral         Complications: No notable events documented.

## 2021-10-15 NOTE — Anesthesia Preprocedure Evaluation (Signed)
Anesthesia Evaluation  Patient identified by MRN, date of birth, ID band Patient awake    Reviewed: Allergy & Precautions, NPO status , Patient's Chart, lab work & pertinent test results  History of Anesthesia Complications Negative for: history of anesthetic complications  Airway Mallampati: II       Dental  (+) Dental Advidsory Given, Teeth Intact Permanent bridge x2:   Pulmonary neg shortness of breath, neg sleep apnea, neg COPD, neg recent URI, Not current smoker, former smoker,           Cardiovascular (-) hypertension(-) Past MI and (-) CHF (-) dysrhythmias (-) Valvular Problems/Murmurs     Neuro/Psych neg Seizures PSYCHIATRIC DISORDERS Depression    GI/Hepatic Neg liver ROS, neg GERD  ,  Endo/Other  neg diabetes  Renal/GU Renal disease (stones)     Musculoskeletal   Abdominal   Peds  Hematology   Anesthesia Other Findings Past Medical History: No date: Arthritis     Comment:  right hip No date: Depression     Comment:  h/o No date: Family history of diabetes mellitus No date: History of kidney stones     Comment:  still has a kidney stone as of 04-16-19  No date: Hyperlipidemia No date: Kidney stone   Reproductive/Obstetrics                             Anesthesia Physical  Anesthesia Plan  ASA: 2  Anesthesia Plan: Spinal   Post-op Pain Management:    Induction: Intravenous  PONV Risk Score and Plan: 1 and Propofol infusion and TIVA  Airway Management Planned: Natural Airway and Nasal Cannula  Additional Equipment:   Intra-op Plan:   Post-operative Plan:   Informed Consent: I have reviewed the patients History and Physical, chart, labs and discussed the procedure including the risks, benefits and alternatives for the proposed anesthesia with the patient or authorized representative who has indicated his/her understanding and acceptance.       Plan Discussed  with:   Anesthesia Plan Comments:         Anesthesia Quick Evaluation

## 2021-10-15 NOTE — Op Note (Signed)
10/15/2021  1:42 PM  PATIENT:  Brandon Galvan  66 y.o. male  PRE-OPERATIVE DIAGNOSIS:  Primary osteoarthritis of left hip  M16.12  POST-OPERATIVE DIAGNOSIS:  Primary osteoarthritis of left hip  M16.12  PROCEDURE:  Procedure(s): TOTAL HIP ARTHROPLASTY ANTERIOR APPROACH (Left)  SURGEON: Laurene Footman, MD  ASSISTANTS: none  ANESTHESIA:   spinal  EBL:  Total I/O In: 800 [I.V.:800] Out: 450 [Urine:150; Blood:300]  BLOOD ADMINISTERED:none  DRAINS:  Incisional wound VAC    LOCAL MEDICATIONS USED:  MARCAINE    and OTHER Exparel  SPECIMEN: Left femoral head  DISPOSITION OF SPECIMEN:  PATHOLOGY  COUNTS:  YES  TOURNIQUET:  * No tourniquets in log *  IMPLANTS: Medacta AMIS 4 standard stem, 54 mm Mpact DM cup and liner with ceramic S 28 mm head  DICTATION: .Dragon Dictation   The patient was brought to the operating room and after spinal anesthesia was obtained patient was placed on the operative table with the ipsilateral foot into the Medacta attachment, contralateral leg on a well-padded table. C-arm was brought in and preop template x-ray taken. After prepping and draping in usual sterile fashion appropriate patient identification and timeout procedures were completed. Anterior approach to the hip was obtained and centered over the greater trochanter and TFL muscle. The subcutaneous tissue was incised hemostasis being achieved by electrocautery. TFL fascia was incised and the muscle retracted laterally deep retractor placed. The lateral femoral circumflex vessels were identified and ligated. The anterior capsule was exposed and a capsulotomy performed. The neck was identified and a femoral neck cut carried out with a saw. The head was removed without difficulty and showed sclerotic femoral head and acetabulum. Reaming was carried out to 54 mm and a 54 mm cup trial gave appropriate tightness to the acetabular component a 54 DM cup was impacted into position. The leg was then  externally rotated and ischiofemoral and pubofemoral releases carried out. The femur was sequentially broached to a size 4, size 4 standard with S head trials were placed and the final components chosen. The 4 standard stem was inserted along with a ceramic S 28 mm head and 54 mm liner. The hip was reduced and was stable the wound was thoroughly irrigated with fibrillar placed along the posterior capsule and medial neck. The deep fascia ws closed using a heavy Quill after infiltration of 30 cc of quarter percent Sensorcaine with epinephrine diluted with Exparel .3-0 V-loc to close the skin with skin staples.  Incisional wound VAC applied and patient was sent to recovery in stable condition.   PLAN OF CARE: Admit for overnight observation

## 2021-10-15 NOTE — Anesthesia Procedure Notes (Signed)
Spinal  Patient location during procedure: OR Start time: 10/15/2021 12:20 PM End time: 10/15/2021 12:22 PM Reason for block: surgical anesthesia Staffing Performed: resident/CRNA  Anesthesiologist: Martha Clan, MD Resident/CRNA: Norm Salt, CRNA Preanesthetic Checklist Completed: patient identified, IV checked, site marked, risks and benefits discussed, surgical consent, monitors and equipment checked and pre-op evaluation Spinal Block Patient position: sitting Prep: ChloraPrep and DuraPrep Patient monitoring: heart rate, continuous pulse ox and blood pressure Approach: midline Location: L3-4 Injection technique: single-shot Needle Needle type: Pencan  Needle gauge: 24 G Needle length: 10 cm Assessment Events: CSF return Additional Notes IV functioning, monitors applied to pt. Expiration date of kit checked and confirmed to be in date. Sterile prep and drape, hand hygiene and sterile gloved used. Pt was positioned and spine was prepped in sterile fashion. Skin was anesthetized with lidocaine. Free flow of clear CSF obtained prior to injecting local anesthetic into CSF x 1 attempt. Spinal needle aspirated freely following injection. Needle was carefully withdrawn, and pt tolerated procedure well. Loss of motor and sensory on exam post injection.

## 2021-10-15 NOTE — Progress Notes (Signed)
PT Cancellation Note  Patient Details Name: Brandon Galvan MRN: [id number] DOB: August 12, 1956   Cancelled Treatment:    Reason Eval/Treat Not Completed: Patient not medically ready: Per nursing pt unable to move LE's at this time and not appropriate for PT evaluation. Will attempt to see pt at a future date/time as medically appropriate.    Linus Salmons PT, DPT 10/15/21, 5:04 PM

## 2021-10-15 NOTE — H&P (Signed)
Chief Complaint  Patient presents with   Pre-op Exam  Scheduled for left THA 10/15/21 with Dr. Rudene Christians    History of the Present Illness: Brandon Galvan is a 66 y.o. male here today for history and physical for left total hip arthroplasty with Dr. Hessie Knows on 10/15/2021. Patient has had a successful prior right total hip arthroplasty. He did very well with this. He has had several years of left hip arthritis and pain but over the last few months the pain has become very severe and limited his activities daily living. He enjoys playing tennis and golf. He has x-rays of the left hip showing complete loss of superior joint space with large osteophytes along the femoral head and acetabulum. Patient's hip pain is severe located along the groin and lateral aspect of the hip. No back pain numbness tingling or radicular symptoms. Patient has seen Dr. Rudene Christians, agreed and consented to a left total hip arthroplasty.  The patient has no history of blood clots.  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  I have reviewed past medical, surgical, social and family history, and allergies as documented in the EMR.  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 Rudene Christians   Past Family History: Family History  Problem Relation Age of Onset   Dementia Mother  severe (for 12 years)   Diabetes type II Father   Heart failure Father   High blood pressure (Hypertension) Father   Dementia Maternal Grandmother   Alzheimer's disease Maternal Grandfather   Dementia Paternal Grandmother   Medications: Current Outpatient  Medications Ordered in Epic  Medication Sig Dispense Refill   acetaminophen (TYLENOL) 500 MG tablet Take 1 tablet (500 mg total) by mouth 2 (two) times daily   brimonidine-timoloL (COMBIGAN) 0.2-0.5 % ophthalmic solution Apply to eye   latanoprost (XALATAN) 0.005 % ophthalmic solution INSTILL 1 DROP IN BOTH EYES AT NIGHT (Patient not taking: Reported on 10/01/2021)   meloxicam (MOBIC) 15 MG tablet Take 1 tablet (15 mg total) by mouth once daily   sildenafiL (VIAGRA) 50 MG tablet TAKE 1 TABLET (50 MG TOTAL) BY MOUTH ONCE DAILY AS NEEDED FOR ERECTILE DYSFUNCTION 6 tablet 9   No current Epic-ordered facility-administered medications on file.   Allergies: Allergies  Allergen Reactions   Penicillin G Anaphylaxis, Hives and Swelling  Pt verbalizes swelling to lips and hives on abd Did it involve swelling of the face/tongue/throat, SOB, or low BP? Yes Did it involve sudden or severe rash/hives, skin peeling, or any reaction on the inside of your mouth or nose? yes Did you need to seek medical attention at a hospital or doctor's office? Yes When did it last happen?   2010    If all above answers are NO, may proceed with cephalosporin use.   Amoxicillin Rash   Diphenhydramine Rash    Body mass index is 30.23 kg/m.  Review of Systems: A comprehensive 14 point ROS was performed, reviewed, and the pertinent orthopaedic findings are documented in the HPI.  Vitals:  10/01/21 0931  BP: (!) 160/102    General Physical Examination:   General:  Well  developed, well nourished, no apparent distress, normal affect, antalgic gait. No assist devices.  HEENT: Head normocephalic, atraumatic, PERRL.   Abdomen: Soft, non tender, non distended, Bowel sounds present.  Heart: Examination of the heart reveals regular, rate, and rhythm. There is no murmur noted on ascultation. There is a normal apical pulse.  Lungs: Lungs are clear to auscultation. There is no wheeze, rhonchi, or crackles. There  is normal expansion of bilateral chest walls.   Left hip: On exam, left hip has -10 degrees of internal rotation and 30 degrees of external rotation. No flexion contracture. No swelling or edema throughout the left leg  Radiographs:  AP lateral view of the left hip reviewed by me today from 07/16/2021. Impression: Patient has advanced left hip osteoarthritis with complete loss of joint space in the superior acetabulum with large superior acetabular spur and severe sclerotic changes. Large inferior acetabular spurring with central joint space narrowing and sclerotic changes. No evidence of acute bony abnormality or abnormal bony lesions.  Assessment: ICD-10-CM  1. Primary osteoarthritis of left hip M16.12   Plan:  31. 66 year old male with advanced left hip osteoarthritis. Pain has interfered with quality of life and activities of daily living. Risks, benefits, complications of a left total hip arthroplasty have been discussed with the patient. Patient has agreed and consented procedure with Dr. Hessie Knows on 10/15/2021.  Electronically signed by Feliberto Gottron, PA at 10/04/2021 8:45 AM EST  Reviewed  H+P. No changes noted.

## 2021-10-16 DIAGNOSIS — M1612 Unilateral primary osteoarthritis, left hip: Secondary | ICD-10-CM | POA: Diagnosis not present

## 2021-10-16 LAB — CBC
HCT: 37.7 % — ABNORMAL LOW (ref 39.0–52.0)
Hemoglobin: 13.2 g/dL (ref 13.0–17.0)
MCH: 32.5 pg (ref 26.0–34.0)
MCHC: 35 g/dL (ref 30.0–36.0)
MCV: 92.9 fL (ref 80.0–100.0)
Platelets: 258 10*3/uL (ref 150–400)
RBC: 4.06 MIL/uL — ABNORMAL LOW (ref 4.22–5.81)
RDW: 13.2 % (ref 11.5–15.5)
WBC: 12.1 10*3/uL — ABNORMAL HIGH (ref 4.0–10.5)
nRBC: 0 % (ref 0.0–0.2)

## 2021-10-16 LAB — BASIC METABOLIC PANEL
Anion gap: 5 (ref 5–15)
BUN: 12 mg/dL (ref 8–23)
CO2: 26 mmol/L (ref 22–32)
Calcium: 8.6 mg/dL — ABNORMAL LOW (ref 8.9–10.3)
Chloride: 98 mmol/L (ref 98–111)
Creatinine, Ser: 0.79 mg/dL (ref 0.61–1.24)
GFR, Estimated: >60 mL/min (ref >60)
Glucose, Bld: 136 mg/dL — ABNORMAL HIGH (ref 70–99)
Potassium: 4.6 mmol/L (ref 3.5–5.1)
Sodium: 129 mmol/L — ABNORMAL LOW (ref 135–145)

## 2021-10-16 MED ORDER — HYDROCODONE-ACETAMINOPHEN 5-325 MG PO TABS: 1.0000 | ORAL_TABLET | ORAL | 0 refills | Status: DC | PRN

## 2021-10-16 MED ORDER — TRAMADOL HCL 50 MG PO TABS: 50.0000 mg | ORAL_TABLET | Freq: Four times a day (QID) | ORAL | 0 refills | Status: DC

## 2021-10-16 MED ORDER — ENOXAPARIN SODIUM 40 MG/0.4ML IJ SOSY: 40.0000 mg | PREFILLED_SYRINGE | INTRAMUSCULAR | 0 refills | Status: DC

## 2021-10-16 NOTE — Plan of Care (Signed)
°  Problem: Education: Goal: Knowledge of General Education information will improve Description: Including pain rating scale, medication(s)/side effects and non-pharmacologic comfort measures Outcome: Progressing   Problem: Health Behavior/Discharge Planning: Goal: Ability to manage health-related needs will improve Outcome: Progressing   Problem: Clinical Measurements: Goal: Ability to maintain clinical measurements within normal limits will improve Outcome: Progressing Goal: Will remain free from infection Outcome: Progressing Goal: Diagnostic test results will improve Outcome: Progressing Goal: Respiratory complications will improve Outcome: Progressing Goal: Cardiovascular complication will be avoided Outcome: Progressing   Problem: Activity: Goal: Risk for activity intolerance will decrease Outcome: Progressing   Problem: Nutrition: Goal: Adequate nutrition will be maintained Outcome: Progressing   Problem: Coping: Goal: Level of anxiety will decrease Outcome: Progressing   Problem: Pain Managment: Goal: General experience of comfort will improve Outcome: Progressing   Problem: Safety: Goal: Ability to remain free from injury will improve Outcome: Progressing   Problem: Skin Integrity: Goal: Risk for impaired skin integrity will decrease Outcome: Progressing   Problem: Education: Goal: Knowledge of the prescribed therapeutic regimen will improve Outcome: Progressing Goal: Understanding of discharge needs will improve Outcome: Progressing Goal: Individualized Educational Video(s) Outcome: Progressing   Problem: Clinical Measurements: Goal: Postoperative complications will be avoided or minimized Outcome: Progressing

## 2021-10-16 NOTE — Progress Notes (Signed)
Physical Therapy Treatment Patient Details Name: Brandon Galvan MRN: [id number] DOB: [birth year] Today's Date: 10/16/2021   History of Present Illness Brandon Galvan is a 66 y.o. male s/p left total hip arthroplasty with Dr. Hessie Knows on 10/15/2021, anterior approach. Patient has previously had a successful prior right total hip arthroplasty (approx 2 years prior)    PT Comments    Gradual progression in gait distance and overall activity tolerance, completign 120' of gait with RW, cga/close sup.  Initiated stair training with bilat rails (up/down 1), step to gait pattern, step by step cuing for technique; fair/good stability L LE.  Does endorse significant "burning" to L LE with modified SLS, barrier to further stair progression. Unable to demonstrate ability to safely enter/exit home at this time; would benefit from additional PT session to address gait/stairs prior to discharge.  RN/PA informed and aware.    Recommendations for follow up therapy are one component of a multi-disciplinary discharge planning process, led by the attending physician.  Recommendations may be updated based on patient status, additional functional criteria and insurance authorization.  Follow Up Recommendations  Home health PT     Assistance Recommended at Discharge    Patient can return home with the following A little help with walking and/or transfers;A little help with bathing/dressing/bathroom;Assistance with cooking/housework;Assist for transportation;Help with stairs or ramp for entrance   Equipment Recommendations       Recommendations for Other Services       Precautions / Restrictions Precautions Precautions: Anterior Hip;Fall Restrictions Weight Bearing Restrictions: Yes LLE Weight Bearing: Weight bearing as tolerated     Mobility  Bed Mobility               General bed mobility comments: seated in recliner beginnign/end of treatment session    Transfers Overall transfer  level: Needs assistance Equipment used: Rolling walker (2 wheels) Transfers: Sit to/from Stand Sit to Stand: Supervision, Min guard           General transfer comment: increased time for transfer; heavy use of UE assist    Ambulation/Gait Ambulation/Gait assistance: Supervision Gait Distance (Feet): 120 Feet Assistive device: Rolling walker (2 wheels)         General Gait Details: improving step height/length; cuing for progression towards continuous cadence (to optimize advancement to L LE).  Slow and deliberate, but no overt buckling or LOB. Heavy WBing bilat UEs.   Stairs Stairs: Yes Stairs assistance: Min assist Stair Management: Two rails Number of Stairs: 1 General stair comments: step to gait pattern, step by step cuing for technique; fair/good stability L LE.  Does endorse significant "burning" to L LE with modified SLS, barrier to further stair progression.   Wheelchair Mobility    Modified Rankin (Stroke Patients Only)       Balance Overall balance assessment: Needs assistance Sitting-balance support: No upper extremity supported, Feet supported Sitting balance-Leahy Scale: Good     Standing balance support: Bilateral upper extremity supported Standing balance-Leahy Scale: Fair                              Cognition Arousal/Alertness: Awake/alert Behavior During Therapy: WFL for tasks assessed/performed Overall Cognitive Status: Within Functional Limits for tasks assessed  Exercises Other Exercises Other Exercises: Seated LE therex, 1x10: ankle pumps, LAQs, marching in short-sitting (act assist for L LE marching); hip abduct/adduct and heel slides (in long-sitting). Other Exercises: Upper body dressing, set up/sup; lower body dressing, mod/max assist to thread L LE. Sit/stand from recliner, close sup with RW; min assist to pull pants over hips.  Maintains balance well in standing;  good use of UE support to optimize safety, does offset weight to R LE    General Comments        Pertinent Vitals/Pain Pain Assessment Pain Assessment: 0-10 Pain Score: 5  Pain Location: left hip Pain Descriptors / Indicators: Aching, Burning, Guarding Pain Intervention(s): Limited activity within patient's tolerance, Monitored during session, Premedicated before session, Repositioned    Home Living                          Prior Function            PT Goals (current goals can now be found in the care plan section) Acute Rehab PT Goals Patient Stated Goal: to return home PT Goal Formulation: With patient Time For Goal Achievement: 10/30/21 Potential to Achieve Goals: Good Progress towards PT goals: Progressing toward goals    Frequency    BID      PT Plan Current plan remains appropriate    Co-evaluation              AM-PAC PT "6 Clicks" Mobility   Outcome Measure  Help needed turning from your back to your side while in a flat bed without using bedrails?: A Little Help needed moving from lying on your back to sitting on the side of a flat bed without using bedrails?: A Little Help needed moving to and from a bed to a chair (including a wheelchair)?: A Little Help needed standing up from a chair using your arms (e.g., wheelchair or bedside chair)?: A Little Help needed to walk in hospital room?: A Little Help needed climbing 3-5 steps with a railing? : A Little 6 Click Score: 18    End of Session Equipment Utilized During Treatment: Gait belt Activity Tolerance: Patient tolerated treatment well;Patient limited by pain Patient left: in chair;with call bell/phone within reach;with chair alarm set Nurse Communication: Mobility status PT Visit Diagnosis: Muscle weakness (generalized) (M62.81);Difficulty in walking, not elsewhere classified (R26.2);Pain Pain - Right/Left: Left Pain - part of body: Hip     Time: 6010-9323 PT Time Calculation  (min) (ACUTE ONLY): 27 min  Charges:  $Gait Training: 8-22 mins $Therapeutic Exercise: 8-22 mins                     Kamaljit Hizer H. Owens Shark, PT, DPT, NCS 10/16/21, 5:22 PM [phone]

## 2021-10-16 NOTE — Evaluation (Signed)
Physical Therapy Evaluation Patient Details Name: Brandon Galvan MRN: [id number] DOB: 1956-08-26 Today's Date: 10/16/2021  History of Present Illness  Drayton Tieu is a 66 y.o. male s/p left total hip arthroplasty with Dr. Hessie Knows on 10/15/2021, anterior approach. Patient has previously had a successful prior right total hip arthroplasty (approx 2 years prior)  Clinical Impression  Patient seated in recliner upon arrival to room; alert and oriented, follows commands and agreeable to participation with session.  Rates L hip pain 5-6/10; meds received prior to session.  Frequent reference to "stiffness" in hip post-op.  L hip pain grossly 3-/5; generally guarded and effortful with all movement.  Does endorse 'burning' sensation to L hip with extension; resolves somewhat with accommodation to upright position.  Able to complete sit/stand, basic transfers and gait (50') with RW, cga/close sup.  Demonstrates 3-point, step to gait pattern; increased effort for L LE advancement. Very slow and deliberate; distance limited by pain in L LE.  Will continue to progress in subsequent sessions. Would benefit from skilled PT to address above deficits and promote optimal return to PLOF.; Recommend transition to HHPT upon discharge from acute hospitalization.        Recommendations for follow up therapy are one component of a multi-disciplinary discharge planning process, led by the attending physician.  Recommendations may be updated based on patient status, additional functional criteria and insurance authorization.  Follow Up Recommendations Home health PT    Assistance Recommended at Discharge    Patient can return home with the following  A little help with walking and/or transfers;A little help with bathing/dressing/bathroom;Assistance with cooking/housework;Assist for transportation;Help with stairs or ramp for entrance    Equipment Recommendations    Recommendations for Other Services        Functional Status Assessment Patient has had a recent decline in their functional status and demonstrates the ability to make significant improvements in function in a reasonable and predictable amount of time.     Precautions / Restrictions Precautions Precautions: Anterior Hip;Fall Restrictions Weight Bearing Restrictions: Yes LLE Weight Bearing: Weight bearing as tolerated      Mobility  Bed Mobility               General bed mobility comments: seated in recliner beginnign/end of treatment session    Transfers Overall transfer level: Needs assistance Equipment used: Rolling walker (2 wheels) Transfers: Sit to/from Stand Sit to Stand: Min guard           General transfer comment: increased time for transfer; heavy use of UE assist    Ambulation/Gait Ambulation/Gait assistance: Min guard, Supervision Gait Distance (Feet): 50 Feet Assistive device: Rolling walker (2 wheels)         General Gait Details: 3-point, step to gait pattern; increased effort for L LE advancement. Very slow and deliberate; distance limited by pain in L LE  Stairs            Wheelchair Mobility    Modified Rankin (Stroke Patients Only)       Balance Overall balance assessment: Needs assistance Sitting-balance support: No upper extremity supported, Feet supported Sitting balance-Leahy Scale: Good     Standing balance support: Bilateral upper extremity supported Standing balance-Leahy Scale: Fair                               Pertinent Vitals/Pain Pain Assessment Pain Assessment: 0-10 Pain Score: 6  Pain Location: left hip Pain  Descriptors / Indicators: Aching, Burning, Guarding Pain Intervention(s): Limited activity within patient's tolerance, Monitored during session, Premedicated before session, Repositioned    Home Living Family/patient expects to be discharged to:: Private residence Living Arrangements: Spouse/significant other Available Help at  Discharge: Family Type of Home: House Home Access: Stairs to enter Entrance Stairs-Rails: Can reach both Entrance Stairs-Number of Steps: 4 Alternate Level Stairs-Number of Steps: flight Home Layout: Two level;1/2 bath on main level;Bed/bath upstairs Home Equipment: Conservation officer, nature (2 wheels);Cane - single point;BSC/3in1      Prior Function Prior Level of Function : Independent/Modified Independent;Working/employed;Driving             Mobility Comments: Pt reports ambulating prior to admission independently but occasionally used cane in the last few weeks. ADLs Comments: Independent with ADL and IADL tasks, works full time for [employer] Hand: Right    Extremity/Trunk Assessment   Upper Extremity Assessment Upper Extremity Assessment: Overall WFL for tasks assessed    Lower Extremity Assessment Lower Extremity Assessment: Generalized weakness (L hip grossly 3-/5, limited by post-op soreness/guarding; otherwise, grossly WFL)       Communication   Communication: No difficulties  Cognition Arousal/Alertness: Awake/alert Behavior During Therapy: WFL for tasks assessed/performed Overall Cognitive Status: Within Functional Limits for tasks assessed                                          General Comments      Exercises Other Exercises Other Exercises: Reviewed role of PT and progressive mobility, L LE WBing restrictions/implications, use of RW for transfers and gait; patient voiced understanding and agreement. Other Exercises: Upper body dressing, set up/sup; lower body dressing, mod/max assist to thread L LE. Sit/stand from recliner, close sup with RW; min assist to pull pants over hips.  Maintains balance well in standing; good use of UE support to optimize safety, does offset weight to R LE   Assessment/Plan    PT Assessment Patient needs continued PT services  PT Problem List Decreased strength;Decreased range of  motion;Decreased balance;Decreased activity tolerance;Decreased mobility;Decreased knowledge of use of DME;Decreased safety awareness;Decreased knowledge of precautions       PT Treatment Interventions DME instruction;Gait training;Stair training;Functional mobility training;Therapeutic activities;Therapeutic exercise;Balance training;Patient/family education    PT Goals (Current goals can be found in the Care Plan section)  Acute Rehab PT Goals Patient Stated Goal: to return home PT Goal Formulation: With patient Time For Goal Achievement: 10/30/21 Potential to Achieve Goals: Good    Frequency BID     Co-evaluation               AM-PAC PT "6 Clicks" Mobility  Outcome Measure Help needed turning from your back to your side while in a flat bed without using bedrails?: A Little Help needed moving from lying on your back to sitting on the side of a flat bed without using bedrails?: A Little Help needed moving to and from a bed to a chair (including a wheelchair)?: A Little Help needed standing up from a chair using your arms (e.g., wheelchair or bedside chair)?: A Little Help needed to walk in hospital room?: A Little Help needed climbing 3-5 steps with a railing? : A Little 6 Click Score: 18    End of Session   Activity Tolerance: Patient tolerated treatment well;Patient limited by pain Patient left:  in chair;with call bell/phone within reach;with chair alarm set Nurse Communication: Mobility status PT Visit Diagnosis: Muscle weakness (generalized) (M62.81);Difficulty in walking, not elsewhere classified (R26.2);Pain Pain - Right/Left: Left Pain - part of body: Hip    Time: 2111-7356 PT Time Calculation (min) (ACUTE ONLY): 38 min   Charges:   PT Evaluation $PT Eval Moderate Complexity: 1 Mod PT Treatments $Therapeutic Activity: 23-37 mins   Marnisha Stampley H. Owens Shark, PT, DPT, NCS 10/16/21, 1:13 PM [phone]

## 2021-10-16 NOTE — Evaluation (Signed)
Occupational Therapy Evaluation Patient Details Name: Brandon Galvan MRN: [id number] DOB: [birth year] Today's Date: 10/16/2021   History of Present Illness Bellamy Judson is a 66 y.o. male s/p left total hip arthroplasty with Dr. Hessie Knows on 10/15/2021, anterior approach. Patient has had a successful prior right total hip arthroplasty.   Clinical Impression   Pt seen for OT evaluation this date s/p left total hip arthroplasty with anterior approach.  Patient also has a history of right total hip replacement.  Wife present during evaluation and reports they live in a 2 story home with bedroom on 2nd level with a flight of steps to access.  Has 1/2 bath on main level.  Pt with increased pain this date in left hip, 5/10 and lethargic at times during session.  Pt presents with decreased ROM, pain, decreased transfers, functional mobility, and decreased ability to perform self care tasks.  He would benefit from skilled OT services to maximize safety and independence in necessary daily tasks to return home with wife.  He reports having a BSC to place over the toilet as needed, also has a walker and cane. He may benefit from [facility] upon discharge.       Recommendations for follow up therapy are one component of a multi-disciplinary discharge planning process, led by the attending physician.  Recommendations may be updated based on patient status, additional functional criteria and insurance authorization.   Follow Up Recommendations  Home health OT    Assistance Recommended at Discharge Intermittent Supervision/Assistance  Patient can return home with the following A little help with bathing/dressing/bathroom;Assistance with cooking/housework;A little help with walking and/or transfers    Functional Status Assessment  Patient has had a recent decline in their functional status and demonstrates the ability to make significant improvements in function in a reasonable and predictable amount of time.   Equipment Recommendations       Recommendations for Other Services       Precautions / Restrictions Precautions Precautions: Anterior Hip Restrictions Weight Bearing Restrictions: Yes LLE Weight Bearing: Weight bearing as tolerated      Mobility Bed Mobility               General bed mobility comments: up to the chair on arrival    Transfers Overall transfer level: Needs assistance Equipment used: Rolling walker (2 wheels) Transfers: Sit to/from Stand Sit to Stand: Min guard           General transfer comment: increased time for transfer      Balance Overall balance assessment: Mild deficits observed, not formally tested, Needs assistance Sitting-balance support: Feet supported Sitting balance-Leahy Scale: Good     Standing balance support: Bilateral upper extremity supported Standing balance-Leahy Scale: Fair                             ADL either performed or assessed with clinical judgement   ADL Overall ADL's : Needs assistance/impaired Eating/Feeding: Modified independent   Grooming: Modified independent;Sitting   Upper Body Bathing: Modified independent;Sitting   Lower Body Bathing: Minimal assistance   Upper Body Dressing : Modified independent   Lower Body Dressing: Minimal assistance;Moderate assistance   Toilet Transfer: Min guard   Toileting- Clothing Manipulation and Hygiene: Min guard       Functional mobility during ADLs: Min guard;Rolling walker (2 wheels) General ADL Comments: Wife present during evaluation, pt requiring increased assist with lower body self care due to increased surgical pain.  Wife able to assist patient as needed.  Will plan to instruct on adaptive equipment for lower body self care to use PRN.     Vision Baseline Vision/History: 1 Wears glasses Patient Visual Report: No change from baseline       Perception     Praxis      Pertinent Vitals/Pain Pain Assessment Pain Assessment:  0-10 Pain Score: 5  Pain Location: left hip Pain Descriptors / Indicators: Aching, Burning Pain Intervention(s): Limited activity within patient's tolerance, Monitored during session, Repositioned     Hand Dominance Right   Extremity/Trunk Assessment Upper Extremity Assessment Upper Extremity Assessment: Overall WFL for tasks assessed   Lower Extremity Assessment Lower Extremity Assessment: Defer to PT evaluation       Communication Communication Communication: No difficulties   Cognition Arousal/Alertness: Awake/alert Behavior During Therapy: WFL for tasks assessed/performed Overall Cognitive Status: Within Functional Limits for tasks assessed                                 General Comments: Lethargic at times during session, closing eyes but still conversational     General Comments       Exercises     Shoulder Instructions      Home Living Family/patient expects to be discharged to:: Private residence Living Arrangements: Spouse/significant other Available Help at Discharge: Family Type of Home: House Home Access: Stairs to enter Technical brewer of Steps: 4 Entrance Stairs-Rails: Can reach both Home Layout: Two level;1/2 bath on main level;Bed/bath upstairs Alternate Level Stairs-Number of Steps: flight Alternate Level Stairs-Rails: Left Bathroom Shower/Tub: Walk-in shower;Curtain   Bathroom Toilet: Handicapped height (has BSC to place over toilet)     Home Equipment: Conservation officer, nature (2 wheels);Cane - single point          Prior Functioning/Environment Prior Level of Function : Independent/Modified Independent;Working/employed;Driving             Mobility Comments: Pt reports ambulating prior to admission independently but occasionally used cane in the last few weeks. ADLs Comments: Independent with ADL and IADL tasks, works full time for [employer]        OT Problem List: Decreased knowledge of use of DME or  AE;Decreased range of motion;Decreased activity tolerance;Impaired balance (sitting and/or standing);Pain      OT Treatment/Interventions: Self-care/ADL training;Therapeutic exercise;Patient/family education;Balance training;Therapeutic activities;DME and/or AE instruction    OT Goals(Current goals can be found in the care plan section) Acute Rehab OT Goals Patient Stated Goal: to be as independent as possible, go home with wife. OT Goal Formulation: With patient/family Time For Goal Achievement: 10/30/21 Potential to Achieve Goals: Good ADL Goals Pt Will Perform Lower Body Dressing: with set-up Pt Will Transfer to Toilet: with modified independence  OT Frequency: Min 2X/week    Co-evaluation              AM-PAC OT "6 Clicks" Daily Activity     Outcome Measure Help from another person eating meals?: None Help from another person taking care of personal grooming?: None Help from another person toileting, which includes using toliet, bedpan, or urinal?: A Little Help from another person bathing (including washing, rinsing, drying)?: A Little Help from another person to put on and taking off regular upper body clothing?: None Help from another person to put on and taking off regular lower body clothing?: A Little 6 Click Score: 21   End of Session Equipment Utilized During  Treatment: Gait belt;Rolling walker (2 wheels)  Activity Tolerance: Patient tolerated treatment well;Patient limited by pain Patient left: in chair;with call bell/phone within reach;with chair alarm set;with family/visitor present  OT Visit Diagnosis: Muscle weakness (generalized) (M62.81);Pain;Unsteadiness on feet (R26.81) Pain - Right/Left: Left Pain - part of body: Hip                Time: 2409-7353 OT Time Calculation (min): 20 min Charges:  OT General Charges $OT Visit: 1 Visit OT Evaluation $OT Eval Low Complexity: 1 Low  Jeriko Kowalke T Jhase Creppel, OTR/L, CLT   Chloeann Alfred 10/16/2021, 11:51 AM

## 2021-10-16 NOTE — Progress Notes (Signed)
°  Subjective: 1 Day Post-Op Procedure(s) (LRB): TOTAL HIP ARTHROPLASTY ANTERIOR APPROACH (Left) Patient reports pain as moderate.  His pain is becoming more manageable. Patient is well, and has had no acute complaints or problems Plan is to go Home after hospital stay. Negative for chest pain and shortness of breath Fever: no Gastrointestinal: Negative for nausea and vomiting  Objective: Vital signs in last 24 hours: Temp:  [97 F (36.1 C)-97.5 F (36.4 C)] 97.5 F (36.4 C) (01/27 1956) Pulse Rate:  [46-80] 67 (01/27 1956) Resp:  [13-24] 18 (01/27 1956) BP: (89-159)/(65-97) 150/94 (01/27 1956) SpO2:  [95 %-100 %] 99 % (01/27 1956) Weight:  [86.2 kg] 86.2 kg (01/27 1121)  Intake/Output from previous day:  Intake/Output Summary (Last 24 hours) at 10/16/2021 0803 Last data filed at 10/16/2021 0311 Gross per 24 hour  Intake 1523.84 ml  Output 550 ml  Net 973.84 ml    Intake/Output this shift: No intake/output data recorded.  Labs: Recent Labs    10/15/21 1715 10/16/21 0630  HGB 13.8 13.2   Recent Labs    10/15/21 1715 10/16/21 0630  WBC 13.0* 12.1*  RBC 4.23 4.06*  HCT 40.1 37.7*  PLT 268 258   Recent Labs    10/15/21 1715 10/16/21 0630  NA  --  129*  K  --  4.6  CL  --  98  CO2  --  26  BUN  --  12  CREATININE 0.80 0.79  GLUCOSE  --  136*  CALCIUM  --  8.6*   No results for input(s): LABPT, INR in the last 72 hours.   EXAM General - Patient is Alert and Oriented Extremity - Neurovascular intact Sensation intact distally Dorsiflexion/Plantar flexion intact Compartment soft Dressing/Incision - clean, dry, with the wound VAC in place Motor Function - intact, moving foot and toes well on exam.   Past Medical History:  Diagnosis Date   Arthritis    right hip   Depression    h/o   Family history of diabetes mellitus    History of kidney stones    still has a kidney stone as of 04-16-19    Hyperlipidemia    Kidney stone      Assessment/Plan: 1 Day Post-Op Procedure(s) (LRB): TOTAL HIP ARTHROPLASTY ANTERIOR APPROACH (Left) Principal Problem:   Status post total hip replacement, left  Estimated body mass index is 29.76 kg/m as calculated from the following:   Height as of this encounter: 5\' 7"  (1.702 m).   Weight as of this encounter: 86.2 kg. Advance diet Up with therapy D/C IV fluids Discharge home with home health  DVT Prophylaxis - Lovenox, Foot Pumps, and TED hose Weight-Bearing as tolerated to left leg  Reche Dixon, PA-C Orthopaedic Surgery 10/16/2021, 8:03 AM

## 2021-10-16 NOTE — Discharge Instructions (Signed)
ANTERIOR APPROACH TOTAL HIP REPLACEMENT POSTOPERATIVE DIRECTIONS   Hip Rehabilitation, Guidelines Following Surgery  The results of a hip operation are greatly improved after range of motion and muscle strengthening exercises. Follow all safety measures which are given to protect your hip. If any of these exercises cause increased pain or swelling in your joint, decrease the amount until you are comfortable again. Then slowly increase the exercises. Call your caregiver if you have problems or questions.   HOME CARE INSTRUCTIONS  Remove items at home which could result in a fall. This includes throw rugs or furniture in walking pathways.  ICE to the affected hip every three hours for 30 minutes at a time and then as needed for pain and swelling.  Continue to use ice on the hip for pain and swelling from surgery. You may notice swelling that will progress down to the foot and ankle.  This is normal after surgery.  Elevate the leg when you are not up walking on it.   Continue to use the breathing machine which will help keep your temperature down.  It is common for your temperature to cycle up and down following surgery, especially at night when you are not up moving around and exerting yourself.  The breathing machine keeps your lungs expanded and your temperature down. Do not place pillow under knee, focus on keeping the knee straight while resting  DIET You may resume your previous home diet once your are discharged from the hospital.  DRESSING / WOUND CARE / SHOWERING Keep the wound VAC on your left hip while keeping it dry.  After about 5 to 7 days the wound VAC will begin to sound an alarm.  The battery is dying.  Home health physical therapy can help you change the bandage by removing the wound VAC and applying a new bandage.  ACTIVITY Walk with your walker as instructed. Use walker as long as suggested by your caregivers. Avoid periods of inactivity such as sitting longer than an hour when  not asleep. This helps prevent blood clots.  You may resume a sexual relationship in one month or when given the OK by your doctor.  You may return to work once you are cleared by your doctor.  Do not drive a car for 6 weeks or until released by you surgeon.  Do not drive while taking narcotics.  WEIGHT BEARING Weight bearing as tolerated with assist device (walker, cane, etc) as directed, use it as long as suggested by your surgeon or therapist, typically at least 4-6 weeks.  POSTOPERATIVE CONSTIPATION PROTOCOL Constipation - defined medically as fewer than three stools per week and severe constipation as less than one stool per week.  One of the most common issues patients have following surgery is constipation.  Even if you have a regular bowel pattern at home, your normal regimen is likely to be disrupted due to multiple reasons following surgery.  Combination of anesthesia, postoperative narcotics, change in appetite and fluid intake all can affect your bowels.  In order to avoid complications following surgery, here are some recommendations in order to help you during your recovery period.  Colace (docusate) - Pick up an over-the-counter form of Colace or another stool softener and take twice a day as long as you are requiring postoperative pain medications.  Take with a full glass of water daily.  If you experience loose stools or diarrhea, hold the colace until you stool forms back up.  If your symptoms do not get  better within 1 week or if they get worse, check with your doctor.  Dulcolax (bisacodyl) - Pick up over-the-counter and take as directed by the product packaging as needed to assist with the movement of your bowels.  Take with a full glass of water.  Use this product as needed if not relieved by Colace only.   MiraLax (polyethylene glycol) - Pick up over-the-counter to have on hand.  MiraLax is a solution that will increase the amount of water in your bowels to assist with bowel  movements.  Take as directed and can mix with a glass of water, juice, soda, coffee, or tea.  Take if you go more than two days without a movement. Do not use MiraLax more than once per day. Call your doctor if you are still constipated or irregular after using this medication for 7 days in a row.  If you continue to have problems with postoperative constipation, please contact the office for further assistance and recommendations.  If you experience "the worst abdominal pain ever" or develop nausea or vomiting, please contact the office immediatly for further recommendations for treatment.  ITCHING  If you experience itching with your medications, try taking only a single pain pill, or even half a pain pill at a time.  You can also use Benadryl over the counter for itching or also to help with sleep.   TED HOSE STOCKINGS Wear the elastic stockings on both legs for three weeks following surgery during the day but you may remove then at night for sleeping.  MEDICATIONS See your medication summary on the After Visit Summary that the nursing staff will review with you prior to discharge.  You may have some home medications which will be placed on hold until you complete the course of blood thinner medication.  It is important for you to complete the blood thinner medication as prescribed by your surgeon.  Continue your approved medications as instructed at time of discharge.  PRECAUTIONS If you experience chest pain or shortness of breath - call 911 immediately for transfer to the hospital emergency department.  If you develop a fever greater that 101 F, purulent drainage from wound, increased redness or drainage from wound, foul odor from the wound/dressing, or calf pain - CONTACT YOUR SURGEON.                                                   FOLLOW-UP APPOINTMENTS Make sure you keep all of your appointments after your operation with your surgeon and caregivers. You should call the office at the  above phone number and make an appointment for approximately two weeks after the date of your surgery or on the date instructed by your surgeon outlined in the "After Visit Summary".  RANGE OF MOTION AND STRENGTHENING EXERCISES  These exercises are designed to help you keep full movement of your hip joint. Follow your caregiver's or physical therapist's instructions. Perform all exercises about fifteen times, three times per day or as directed. Exercise both hips, even if you have had only one joint replacement. These exercises can be done on a training (exercise) mat, on the floor, on a table or on a bed. Use whatever works the best and is most comfortable for you. Use music or television while you are exercising so that the exercises are a pleasant break in  your day. This will make your life better with the exercises acting as a break in routine you can look forward to.  Lying on your back, slowly slide your foot toward your buttocks, raising your knee up off the floor. Then slowly slide your foot back down until your leg is straight again.  Lying on your back spread your legs as far apart as you can without causing discomfort.  Lying on your side, raise your upper leg and foot straight up from the floor as far as is comfortable. Slowly lower the leg and repeat.  Lying on your back, tighten up the muscle in the front of your thigh (quadriceps muscles). You can do this by keeping your leg straight and trying to raise your heel off the floor. This helps strengthen the largest muscle supporting your knee.  Lying on your back, tighten up the muscles of your buttocks both with the legs straight and with the knee bent at a comfortable angle while keeping your heel on the floor.   IF YOU ARE TRANSFERRED TO A SKILLED REHAB FACILITY If the patient is transferred to a skilled rehab facility following release from the hospital, a list of the current medications will be sent to the facility for the patient to  continue.  When discharged from the skilled rehab facility, please have the facility set up the patient's [place] prior to being released. Also, the skilled facility will be responsible for providing the patient with their medications at time of release from the facility to include their pain medication, the muscle relaxants, and their blood thinner medication. If the patient is still at the rehab facility at time of the two week follow up appointment, the skilled rehab facility will also need to assist the patient in arranging follow up appointment in our office and any transportation needs.  MAKE SURE YOU:  Understand these instructions.  Get help right away if you are not doing well or get worse.    Pick up stool softner and laxative for home use following surgery while on pain medications. Do not submerge incision under water. Please use good hand washing techniques while changing dressing each day. May shower starting three days after surgery. Please use a clean towel to pat the incision dry following showers. Continue to use ice for pain and swelling after surgery. Do not use any lotions or creams on the incision until instructed by your surgeon.

## 2021-10-16 NOTE — Discharge Summary (Addendum)
Physician Discharge Summary  Patient ID: Brandon Galvan MRN: [id number] DOB/AGE: [birth year] 66 y.o.  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 male who was admitted 10/15/2021 with a diagnosis of Status post total hip replacement, left and went to the operating room on 10/15/2021 and underwent the above named procedures.    Surgeries: Procedure(s): TOTAL HIP ARTHROPLASTY ANTERIOR APPROACH on 10/15/2021 Patient tolerated the surgery well. Taken to PACU where she was stabilized and then transferred to the orthopedic floor.  Started on Lovenox 40 mg q 24 hrs. Foot pumps applied bilaterally at 80 mm. Heels elevated on bed with rolled towels. No evidence of DVT. Negative H[place]. Physical therapy started on day #1 for gait training and transfer. OT started day #1 for ADL and assisted devices.  Patient's foley was d/c on day #1. Patient's IV  was d/c on day #2.  On post op day #3 patient was stable and ready for discharge to home with HHPT.    He was given perioperative antibiotics:  Anti-infectives (From admission, onward)    Start     Dose/Rate Route  Frequency Ordered Stop   10/15/21 1800  clindamycin (CLEOCIN) IVPB 900 mg        900 mg 100 mL/hr over 30 Minutes Intravenous Every 6 hours 10/15/21 1704 10/16/21 0018   10/15/21 1144  clindamycin (CLEOCIN) 900 MG/50ML IVPB       Note to Pharmacy: Jordan Hawks H: cabinet override      10/15/21 1144 10/15/21 1228   10/15/21 0600  clindamycin (CLEOCIN) IVPB 900 mg        900 mg 100 mL/hr over 30 Minutes Intravenous On call to O.R. 10/15/21 0054 10/15/21 1239     .  He was given sequential compression devices, early ambulation, and Lovenox for DVT prophylaxis.  He benefited maximally from the hospital stay and there were no complications.    Recent vital signs:  Vitals:   10/18/21 0430 10/18/21 0815  BP: 118/81 132/71  Pulse: 90 84  Resp: 18 16  Temp: 98.1 F (36.7 C) 97.6 F (36.4 C)  SpO2: 97% 98%    Recent laboratory studies:  Lab Results  Component Value Date   HGB 13.2 10/17/2021   HGB 13.2 10/16/2021   HGB 13.8 10/15/2021   Lab Results  Component Value Date   WBC 13.8 (H) 10/17/2021   PLT 241 10/17/2021   Lab Results  Component Value Date   INR 1.0 04/16/2019   Lab Results  Component Value Date   NA 129 (L) 10/16/2021   K 4.6  10/16/2021   CL 98 10/16/2021   CO2 26 10/16/2021   BUN 12 10/16/2021   CREATININE 0.79 10/16/2021   GLUCOSE 136 (H) 10/16/2021    Discharge Medications:   Allergies as of 10/18/2021       Reactions   Penicillin G Anaphylaxis, Hives, Swelling   Pt verbalizes swelling to lips and hives on abd Did it involve swelling of the face/tongue/throat, SOB, or low BP? Yes Did it involve sudden or severe rash/hives, skin peeling, or any reaction on the inside of your mouth or nose? yes Did you need to seek medical attention at a hospital or doctor's office? Yes When did it last happen?   2010    If all above answers are NO, may proceed with cephalosporin use.   Amoxicillin Rash   Benadryl [diphenhydramine] Rash         Medication List     TAKE these medications    acetaminophen 500 MG tablet Commonly known as: TYLENOL Take 500 mg by mouth 2 (two) times daily.   brimonidine-timolol 0.2-0.5 % ophthalmic solution Commonly known as: COMBIGAN Place 1 drop into both eyes every 12 (twelve) hours.   enoxaparin 40 MG/0.4ML injection Commonly known as: LOVENOX Inject 0.4 mLs (40 mg total) into the skin daily for 14 days.   HYDROcodone-acetaminophen 5-325 MG tablet Commonly known as: NORCO/VICODIN Take 1-2 tablets by mouth every 4 (four) hours as needed for moderate pain (pain score 4-6).   meloxicam 15 MG tablet Commonly known as: MOBIC Take 15 mg by mouth daily.   tadalafil 10 MG tablet Commonly known as: CIALIS Take 1-2 tablets (10-20 mg total) by mouth daily as needed for erectile dysfunction.   traMADol 50 MG tablet Commonly known as: ULTRAM Take 1 tablet (50 mg total) by mouth every 6 (six) hours.               Durable Medical Equipment  (From admission, onward)           Start     Ordered   10/15/21 1705  DME Walker rolling  Once       Question Answer Comment  Walker: With 5 Inch Wheels   Patient needs a walker to treat with the following condition Status post total hip replacement, left      10/15/21 1704   10/15/21 1705  DME 3 n 1  Once        10/15/21 1704   10/15/21 1705  DME Bedside commode  Once       Question:  Patient needs a bedside commode to treat with the following condition  Answer:  Status post total hip replacement, left   10/15/21 1704            Diagnostic Studies: DG C-Arm 1-60 Min-No Report  Result Date: 10/15/2021 CLINICAL DATA:  Left total hip arthroplasty EXAM: DG HIP (WITH OR WITHOUT PELVIS) 1V*L*; DG C-ARM 1-60 MIN-NO REPORT COMPARISON:  11/14/2018 FINDINGS: A single C-arm fluoroscopic image was obtained intraoperatively and submitted for post operative interpretation. AP view of the left hip demonstrating appropriately positioned total hip  arthroplasty hardware. 6 seconds of fluoroscopy time was utilized. Please see the performing provider's procedural report for further detail. IMPRESSION: As above. Electronically Signed   By: Davina Poke D.O.   On: 10/15/2021 14:30   DG HIP UNILAT WITH PELVIS 1V LEFT  Result Date: 10/15/2021 CLINICAL DATA:  Left total hip arthroplasty EXAM: DG HIP (WITH OR WITHOUT PELVIS) 1V*L*; DG C-ARM 1-60 MIN-NO  REPORT COMPARISON:  11/14/2018 FINDINGS: A single C-arm fluoroscopic image was obtained intraoperatively and submitted for post operative interpretation. AP view of the left hip demonstrating appropriately positioned total hip arthroplasty hardware. 6 seconds of fluoroscopy time was utilized. Please see the performing provider's procedural report for further detail. IMPRESSION: As above. Electronically Signed   By: Davina Poke D.O.   On: 10/15/2021 14:30   DG HIP UNILAT W OR W/O PELVIS 2-3 VIEWS LEFT  Result Date: 10/15/2021 CLINICAL DATA:  Left hip arthroplasty EXAM: DG HIP (WITH OR WITHOUT PELVIS) 2-3V LEFT COMPARISON:  11/14/2018 FINDINGS: Interval postsurgical changes from left total hip arthroplasty. Arthroplasty components are in their expected alignment. No periprosthetic fracture or evidence of other complication. Expected postoperative changes within the overlying soft tissues. IMPRESSION: Satisfactory postoperative appearance status post left total hip arthroplasty. Electronically Signed   By: Davina Poke D.O.   On: 10/15/2021 14:28    Disposition: Discharge disposition: 01-Home or Self Care          Follow-up Information     Duanne Guess, PA-C Follow up in 2 week(s).   Specialties: Orthopedic Surgery, Emergency Medicine Contact information: [place] [address] [phone]                  Signed: Feliberto Gottron 10/18/2021, 8:30 AM

## 2021-10-17 DIAGNOSIS — M1612 Unilateral primary osteoarthritis, left hip: Secondary | ICD-10-CM | POA: Diagnosis not present

## 2021-10-17 LAB — CBC
HCT: 38 % — ABNORMAL LOW (ref 39.0–52.0)
Hemoglobin: 13.2 g/dL (ref 13.0–17.0)
MCH: 33 pg (ref 26.0–34.0)
MCHC: 34.7 g/dL (ref 30.0–36.0)
MCV: 95 fL (ref 80.0–100.0)
Platelets: 241 10*3/uL (ref 150–400)
RBC: 4 MIL/uL — ABNORMAL LOW (ref 4.22–5.81)
RDW: 13.2 % (ref 11.5–15.5)
WBC: 13.8 10*3/uL — ABNORMAL HIGH (ref 4.0–10.5)
nRBC: 0 % (ref 0.0–0.2)

## 2021-10-17 NOTE — Plan of Care (Signed)
Problem: Education: Goal: Knowledge of General Education information will improve Description: Including pain rating scale, medication(s)/side effects and non-pharmacologic comfort measures Outcome: Progressing   Problem: Health Behavior/Discharge Planning: Goal: Ability to manage health-related needs will improve Outcome: Progressing   Problem: Clinical Measurements: Goal: Ability to maintain clinical measurements within normal limits will improve Outcome: Progressing Goal: Will remain free from infection Outcome: Progressing Goal: Diagnostic test results will improve Outcome: Progressing Goal: Respiratory complications will improve Outcome: Progressing Goal: Cardiovascular complication will be avoided Outcome: Progressing   Problem: Clinical Measurements: Goal: Will remain free from infection Outcome: Progressing   Problem: Activity: Goal: Risk for activity intolerance will decrease Outcome: Progressing   Problem: Nutrition: Goal: Adequate nutrition will be maintained Outcome: Progressing   Problem: Coping: Goal: Level of anxiety will decrease Outcome: Progressing   Problem: Elimination: Goal: Will not experience complications related to bowel motility Outcome: Progressing Goal: Will not experience complications related to urinary retention Outcome: Progressing   Problem: Pain Managment: Goal: General experience of comfort will improve Outcome: Progressing   Problem: Safety: Goal: Ability to remain free from injury will improve Outcome: Progressing   Problem: Skin Integrity: Goal: Risk for impaired skin integrity will decrease Outcome: Progressing   Problem: Education: Goal: Knowledge of the prescribed therapeutic regimen will improve Outcome: Completed/Met Goal: Understanding of discharge needs will improve Outcome: Completed/Met Goal: Individualized Educational Video(s) Outcome: Completed/Met   Problem: Clinical Measurements: Goal: Postoperative  complications will be avoided or minimized Outcome: Completed/Met   Problem: Skin Integrity: Goal: Will show signs of wound healing Outcome: Completed/Met

## 2021-10-17 NOTE — Anesthesia Postprocedure Evaluation (Signed)
Anesthesia Post Note  Patient: Brandon Galvan  Procedure(s) Performed: TOTAL HIP ARTHROPLASTY ANTERIOR APPROACH (Left: Hip)  Patient location during evaluation: PACU Anesthesia Type: Spinal Level of consciousness: awake and alert Pain management: pain level controlled Vital Signs Assessment: post-procedure vital signs reviewed and stable Respiratory status: spontaneous breathing, nonlabored ventilation and respiratory function stable Cardiovascular status: blood pressure returned to baseline and stable Postop Assessment: no apparent nausea or vomiting Anesthetic complications: no Comments: Pt complains of weakness with movement of left hip. He states that he was worked with physical therapy and he feels like his strength comes and goes. He has no sensory deficits and he has full ankle strength with flexion/extension. He was able to bend his knee half way to his chest but could not lift against resistance. He was able to straighten leg against resistance but strength was limited. He denies incontinence or back pain. I am not concerned for an acute process such as cauda equina or epidural hematoma. I told him it is likely MSK in nature. I told him there are rare instances of nerve damage and that he can reach back out to [place] or seek neurological examination from neurologist if true weakness persists after the acute rehab phase.      No notable events documented.   Last Vitals:  Vitals:   10/17/21 0419 10/17/21 0826  BP: 135/74 130/62  Pulse: 89 86  Resp: 16 14  Temp: 36.7 C 36.5 C  SpO2: 96% 98%    Last Pain:  Vitals:   10/17/21 0649  TempSrc:   PainSc: 0-No pain                 [place] Ouch

## 2021-10-17 NOTE — Progress Notes (Signed)
°  Subjective: 2 Days Post-Op Procedure(s) (LRB): TOTAL HIP ARTHROPLASTY ANTERIOR APPROACH (Left) Patient reports pain as mild to moderate.  His pain is becoming more manageable. Patient is well, and has had no acute complaints or problems Plan is to go Home after hospital stay. Negative for chest pain and shortness of breath Fever: no Gastrointestinal: Negative for nausea and vomiting  Objective: Vital signs in last 24 hours: Temp:  [97.9 F (36.6 C)-98.2 F (36.8 C)] 98.1 F (36.7 C) (01/29 0419) Pulse Rate:  [86-95] 89 (01/29 0419) Resp:  [15-18] 16 (01/29 0419) BP: (118-139)/(66-83) 135/74 (01/29 0419) SpO2:  [91 %-96 %] 96 % (01/29 0419)  Intake/Output from previous day:  Intake/Output Summary (Last 24 hours) at 10/17/2021 0718 Last data filed at 10/17/2021 0650 Gross per 24 hour  Intake 840 ml  Output 1800 ml  Net -960 ml    Intake/Output this shift: No intake/output data recorded.  Labs: Recent Labs    10/15/21 1715 10/16/21 0630 10/17/21 0429  HGB 13.8 13.2 13.2   Recent Labs    10/16/21 0630 10/17/21 0429  WBC 12.1* 13.8*  RBC 4.06* 4.00*  HCT 37.7* 38.0*  PLT 258 241   Recent Labs    10/15/21 1715 10/16/21 0630  NA  --  129*  K  --  4.6  CL  --  98  CO2  --  26  BUN  --  12  CREATININE 0.80 0.79  GLUCOSE  --  136*  CALCIUM  --  8.6*   No results for input(s): LABPT, INR in the last 72 hours.   EXAM General - Patient is Alert and Oriented Extremity - Neurovascular intact Sensation intact distally Dorsiflexion/Plantar flexion intact Compartment soft Dressing/Incision - clean, dry, with the wound VAC in place Motor Function - intact, moving foot and toes well on exam.  Patient ambulated 120 feet with physical therapy.  Past Medical History:  Diagnosis Date   Arthritis    right hip   Depression    h/o   Family history of diabetes mellitus    History of kidney stones    still has a kidney stone as of 04-16-19    Hyperlipidemia     Kidney stone     Assessment/Plan: 2 Days Post-Op Procedure(s) (LRB): TOTAL HIP ARTHROPLASTY ANTERIOR APPROACH (Left) Principal Problem:   Status post total hip replacement, left  Estimated body mass index is 29.76 kg/m as calculated from the following:   Height as of this encounter: 5\' 7"  (1.702 m).   Weight as of this encounter: 86.2 kg. Advance diet Up with therapy D/C IV fluids Discharge home with home health  DVT Prophylaxis - Lovenox, Foot Pumps, and TED hose Weight-Bearing as tolerated to left leg  Reche Dixon, PA-C Orthopaedic Surgery 10/17/2021, 7:18 AM

## 2021-10-17 NOTE — Care Management Obs Status (Signed)
[place] NOTIFICATION   Patient Details  Name: Brandon Galvan MRN: [id number] Date of Birth: [birth year]   Medicare Observation Status Notification Given:  Yes Patient has discharge order yesterday 10/16/21 at 0809 and the those were discontinued at 1553 10/16/21. MOON explained and given to patient today. Simmie Davies RN CM     Izola Price, RN 10/17/2021, 1:03 PM

## 2021-10-17 NOTE — Progress Notes (Signed)
Physical Therapy Treatment Patient Details Name: Brandon Galvan MRN: [id number] DOB: May 05, 1956 Today's Date: 10/17/2021   History of Present Illness Brandon Galvan is a 66 y.o. male s/p left total hip arthroplasty with Dr. Hessie Knows on 10/15/2021, anterior approach. Patient has previously had a successful prior right total hip arthroplasty (approx 2 years prior)    PT Comments    Pt progressing well with exercises and when recruiting hip flexors, is able to provide increased foot clearance when ambulating.  Pt with good carryover during instructions and was able to navigate the stairs with verbal and visual cues from therapist prior to attempting.  Pt with good technique only noticing one instance in which the pt grabbed the railing instead of the walker.  Pt much safer and confident with ability to ambulate today.  Plan is to stay another night until HHPT can be set up by case management.  Current discharge plans to home with HHPT remain appropriate at this time.  Pt will continue to benefit from skilled therapy in order to address deficits listed below.      Recommendations for follow up therapy are one component of a multi-disciplinary discharge planning process, led by the attending physician.  Recommendations may be updated based on patient status, additional functional criteria and insurance authorization.  Follow Up Recommendations  Home health PT     Assistance Recommended at Discharge    Patient can return home with the following A little help with walking and/or transfers;A little help with bathing/dressing/bathroom;Assistance with cooking/housework;Assist for transportation;Help with stairs or ramp for entrance   Equipment Recommendations       Recommendations for Other Services       Precautions / Restrictions Precautions Precautions: Anterior Hip;Fall Restrictions Weight Bearing Restrictions: Yes LLE Weight Bearing: Weight bearing as tolerated     Mobility   Bed Mobility               General bed mobility comments: seated in recliner beginning/end of treatment session    Transfers Overall transfer level: Needs assistance Equipment used: Rolling walker (2 wheels) Transfers: Sit to/from Stand Sit to Stand: Supervision, Min guard           General transfer comment: increased time for transfer; heavy use of UE assist    Ambulation/Gait Ambulation/Gait assistance: Supervision Gait Distance (Feet): 160 Feet Assistive device: Rolling walker (2 wheels)         General Gait Details: improving step height/length; cuing for progression towards continuous cadence (to optimize advancement to L LE).  Slow and deliberate, but no overt buckling or LOB. Heavy WBing bilat UEs.   Stairs   Stairs assistance: Min assist Stair Management: No rails, Step to pattern, Backwards, With walker Number of Stairs: 4 General stair comments: Good stability with stair training, only on incident where pt grabbed the railing instead of the walker, otherwise safe and with good carryover.   Wheelchair Mobility    Modified Rankin (Stroke Patients Only)       Balance Overall balance assessment: Needs assistance Sitting-balance support: No upper extremity supported, Feet supported Sitting balance-Leahy Scale: Good     Standing balance support: Bilateral upper extremity supported Standing balance-Leahy Scale: Fair                              Cognition Arousal/Alertness: Awake/alert Behavior During Therapy: WFL for tasks assessed/performed Overall Cognitive Status: Within Functional Limits for tasks assessed  Exercises Total Joint Exercises Ankle Circles/Pumps: AROM, Strengthening, Both, 10 reps, Seated Gluteal Sets: AROM, Strengthening, Both, 10 reps, Seated Long Arc Quad: AROM, Strengthening, Both, 10 reps, Seated Marching in Standing: AROM, Strengthening, Both, 20 reps,  Standing    General Comments        Pertinent Vitals/Pain Pain Assessment Pain Assessment: Faces Faces Pain Scale: Hurts little more Pain Location: left hip Pain Descriptors / Indicators: Aching, Burning, Guarding Pain Intervention(s): Limited activity within patient's tolerance, Monitored during session, Premedicated before session, Repositioned    Home Living                          Prior Function            PT Goals (current goals can now be found in the care plan section) Acute Rehab PT Goals Patient Stated Goal: to return home PT Goal Formulation: With patient Time For Goal Achievement: 10/30/21 Potential to Achieve Goals: Good Progress towards PT goals: Progressing toward goals    Frequency    BID      PT Plan Current plan remains appropriate    Co-evaluation              AM-PAC PT "6 Clicks" Mobility   Outcome Measure  Help needed turning from your back to your side while in a flat bed without using bedrails?: A Little Help needed moving from lying on your back to sitting on the side of a flat bed without using bedrails?: A Little Help needed moving to and from a bed to a chair (including a wheelchair)?: A Little Help needed standing up from a chair using your arms (e.g., wheelchair or bedside chair)?: A Little Help needed to walk in hospital room?: A Little Help needed climbing 3-5 steps with a railing? : A Little 6 Click Score: 18    End of Session Equipment Utilized During Treatment: Gait belt Activity Tolerance: Patient tolerated treatment well;Patient limited by pain Patient left: in chair;with call bell/phone within reach;with chair alarm set Nurse Communication: Mobility status PT Visit Diagnosis: Muscle weakness (generalized) (M62.81);Difficulty in walking, not elsewhere classified (R26.2);Pain Pain - Right/Left: Left Pain - part of body: Hip     Time: 5035-4656 PT Time Calculation (min) (ACUTE ONLY): 28 min  Charges:   $Gait Training: 8-22 mins $Therapeutic Exercise: 8-22 mins                     Gwenlyn Saran, PT, DPT 10/17/21, 5:22 PM    Christie Nottingham 10/17/2021, 5:21 PM

## 2021-10-17 NOTE — TOC Progression Note (Signed)
Transition of Care [facility]) - Progression Note    Patient Details  Name: Brandon Galvan MRN: [id number] Date of Birth: Jul 16, 1956  [facility] CM/SW Contact  Izola Price, RN Phone Number: 10/17/2021, 9:53 AM  Clinical Narrative: 1/29: Patient had discharge orders yesterday at 0809 am while in OBS status. DC order was discontinued at 1552. Unable to secure [facility] agency that will accept insurance as out of network.   Patient did not feel safe/strong enough and did not feel at that time when RN CM spoke with patient that outpatient therapy was a logistical option for him. He frankly said he did not feel strong enough/safe to go home yesterday. Hoping more therapy yesterday and he would feel stronger today.   This am, per asking Unit RN how patient is doing, RN stated it took 30 minutes to get him to and from bathroom as he is not lifting operative leg. Provider updated via secure chat regarding these issues/barriers/safe dc plan.   CM plan today to facilitate discharge planning.    RN CM will speak to patient and spouse again today outpatient therapy logistics/transportation issues if any.  RN CM asked provider for PT see patient again with outpatient goal in mind.  TOC will continue to follow throughout the day for any updated orders/needs and will speak to patient/spouse again after therapy today.  Monitor for any provider or therapy orders/changes to plan.   Note for potential barrier to safe discharge: In addition to patient not feeling strong enough/safe for discharge on 1/28, providers were under impression HH had been taken care of via office per usual routine pre-op practice. This patient was excluded by [facility] due to being out of network.  RN CM tried multiple agencies on 1/28, but no agency that returned RN CM calls, had [employer] in network. Update patient on this situation when spoke to patient on 10/16/21.          Expected Discharge Plan and Services            Expected Discharge Date: 10/17/21                                     Social Determinants of Health (SDOH) Interventions    Readmission Risk Interventions No flowsheet data found.

## 2021-10-17 NOTE — TOC Progression Note (Signed)
Transition of Care [facility]) - Progression Note    Patient Details  Name: Brandon Galvan MRN: [id number] Date of Birth: 09-Aug-1956  Transition of Care [facility]) CM/SW Contact  Izola Price, RN Phone Number: 10/17/2021, 1:27 PM  Clinical Narrative: 1/29: In trying to secure [facility] services patient gave number for [employer], which is a partner with Christella Scheuermann that will find in network [facility] agency. RN CM contacted 1/28 115 pm.  Direct Care Management [phone] press #[phone] then extension #[phone].  Fax [phone] Case Management # [id number] Please add to any faxed documents.  Will fax requested documents today.  Attention: Laneta Simmers CM for 1A for Monday call back. Simmie Davies RN CM          Expected Discharge Plan and Services           Expected Discharge Date: 10/17/21                                     Social Determinants of Health (SDOH) Interventions    Readmission Risk Interventions No flowsheet data found.

## 2021-10-18 ENCOUNTER — Encounter: Payer: Self-pay | Admitting: Orthopedic Surgery

## 2021-10-18 DIAGNOSIS — M1612 Unilateral primary osteoarthritis, left hip: Secondary | ICD-10-CM | POA: Diagnosis not present

## 2021-10-18 NOTE — TOC Progression Note (Addendum)
Transition of Care [facility]) - Progression Note    Patient Details  Name: Brandon Galvan MRN: [id number] Date of Birth: Jan 22, 1956  Transition of Care [facility]) CM/SW [place], RN Phone Number: 10/18/2021, 9:38 AM  Clinical Narrative:   Faxed latest PT notes, Progress not and Face sheet to   Direct Care Management [phone] press #7 then extension [phone].  Fax [phone] Case Management # [id number] Please add to any faxed documents.  Will fax requested documents today.  Attention: Charolotte Eke.   To set up [facility] for the patient  The DME has been delivered, his wife provides transportation, no additional needs     Expected Discharge Plan and Services           Expected Discharge Date: 10/18/21               DME Arranged: 3-N-1, Bedside commode, Walker rolling DME Agency: [employer] Date DME Agency Contacted: 10/17/21 Time DME Agency Contacted: [id number] Representative spoke with at DME Agency: Mardene Celeste HH Arranged: PT, OT [place] Agency:  (Pending [employer] finding in network agency to accept.)         Social Determinants of Health (SDOH) Interventions    Readmission Risk Interventions No flowsheet data found.

## 2021-10-18 NOTE — Progress Notes (Signed)
°  Subjective: 3 Days Post-Op Procedure(s) (LRB): TOTAL HIP ARTHROPLASTY ANTERIOR APPROACH (Left) Patient reports pain as mild to mild.   Patient is well, and has had no acute complaints or problems Plan is to go Home after hospital stay. Negative for chest pain and shortness of breath Fever: no Gastrointestinal: Negative for nausea and vomiting  Objective: Vital signs in last 24 hours: Temp:  [97.6 F (36.4 C)-98.2 F (36.8 C)] 97.6 F (36.4 C) (01/30 0815) Pulse Rate:  [84-99] 84 (01/30 0815) Resp:  [16-18] 16 (01/30 0815) BP: (118-133)/(71-81) 132/71 (01/30 0815) SpO2:  [97 %-98 %] 98 % (01/30 0815)  Intake/Output from previous day:  Intake/Output Summary (Last 24 hours) at 10/18/2021 0827 Last data filed at 10/18/2021 8309 Gross per 24 hour  Intake 840 ml  Output 1000 ml  Net -160 ml    Intake/Output this shift: No intake/output data recorded.  Labs: Recent Labs    10/15/21 1715 10/16/21 0630 10/17/21 0429  HGB 13.8 13.2 13.2   Recent Labs    10/16/21 0630 10/17/21 0429  WBC 12.1* 13.8*  RBC 4.06* 4.00*  HCT 37.7* 38.0*  PLT 258 241   Recent Labs    10/15/21 1715 10/16/21 0630  NA  --  129*  K  --  4.6  CL  --  98  CO2  --  26  BUN  --  12  CREATININE 0.80 0.79  GLUCOSE  --  136*  CALCIUM  --  8.6*   No results for input(s): LABPT, INR in the last 72 hours.   EXAM General - Patient is Alert and Oriented Extremity - Neurovascular intact Sensation intact distally Dorsiflexion/Plantar flexion intact Compartment soft Dressing/Incision - clean, dry, with the Provena in place Motor Function - intact, moving foot and toes well on exam.    Past Medical History:  Diagnosis Date   Arthritis    right hip   Depression    h/o   Family history of diabetes mellitus    History of kidney stones    still has a kidney stone as of 04-16-19    Hyperlipidemia    Kidney stone     Assessment/Plan: 3 Days Post-Op Procedure(s) (LRB): TOTAL HIP  ARTHROPLASTY ANTERIOR APPROACH (Left) Principal Problem:   Status post total hip replacement, left  Estimated body mass index is 29.76 kg/m as calculated from the following:   Height as of this encounter: 5\' 7"  (1.702 m).   Weight as of this encounter: 86.2 kg. Advance diet Up with therapy D/C IV fluids Discharge home with home health today  DVT Prophylaxis - Lovenox, Foot Pumps, and TED hose Weight-Bearing as tolerated to left leg  T. Rachelle Hora, PA-C Orthopaedic Surgery 10/18/2021, 8:27 AM

## 2021-10-18 NOTE — Progress Notes (Signed)
DISCHARGE NOTE:  Patient given discharge notes. Patient verbalized understanding. Walker and bedside commode sent with patient. Provena home wound vac in place. Extra dressing sent. Staff wheeled patient to car. Wife provided transportation home.

## 2021-10-18 NOTE — Progress Notes (Signed)
Physical Therapy Treatment Patient Details Name: Brandon Galvan MRN: [id number] DOB: 11-18-1955 Today's Date: 10/18/2021   History of Present Illness Geoffery Aultman is a 66 y.o. male s/p left total hip arthroplasty with Dr. Hessie Knows on 10/15/2021, anterior approach. Patient has previously had a successful prior right total hip arthroplasty (approx 2 years prior)    PT Comments    Pt was pleasant and motivated to participate during the session and put forth good effort throughout. Pt required no physical assistance during the session and demonstrated good control and stability with transfers, gait, and stair training.  Pt reported no adverse symptoms during the session other than moderate LLE pain that did not worsen with activity.  Pt will benefit from HHPT upon discharge to safely address deficits listed in patient problem list for decreased caregiver assistance and eventual return to PLOF.     Recommendations for follow up therapy are one component of a multi-disciplinary discharge planning process, led by the attending physician.  Recommendations may be updated based on patient status, additional functional criteria and insurance authorization.  Follow Up Recommendations  Home health PT     Assistance Recommended at Discharge    Patient can return home with the following A little help with walking and/or transfers;A little help with bathing/dressing/bathroom;Assistance with cooking/housework;Assist for transportation;Help with stairs or ramp for entrance   Equipment Recommendations       Recommendations for Other Services       Precautions / Restrictions Precautions Precautions: Anterior Hip;Fall Restrictions Weight Bearing Restrictions: Yes LLE Weight Bearing: Weight bearing as tolerated     Mobility  Bed Mobility               General bed mobility comments: NT, pt in recliner    Transfers Overall transfer level: Needs assistance Equipment used: Rolling  walker (2 wheels) Transfers: Sit to/from Stand Sit to Stand: Supervision           General transfer comment: Min verbal cues for hand placement    Ambulation/Gait Ambulation/Gait assistance: Supervision Gait Distance (Feet): 150 Feet Assistive device: Rolling walker (2 wheels) Gait Pattern/deviations: Step-to pattern, Step-through pattern, Antalgic, Decreased stance time - left Gait velocity: decreased     General Gait Details: Antalagic step-to pattern that slowly progressed to step-through but with decreased RLE step length.  Pt steady with mod cues for increased RLE step length and decreased UE WB on the RW   Stairs Stairs: Yes Stairs assistance: Min guard Stair Management: No rails, Step to pattern, Backwards, With walker Number of Stairs: 4 General stair comments: Verbal and visual cues prior to practice with teach back method; Pt able to ascend and descend 4 steps with good stabiltiy and with only one verbal cue for sequencing   Wheelchair Mobility    Modified Rankin (Stroke Patients Only)       Balance Overall balance assessment: Needs assistance Sitting-balance support: No upper extremity supported, Feet supported Sitting balance-Leahy Scale: Good     Standing balance support: Bilateral upper extremity supported, During functional activity Standing balance-Leahy Scale: Good                              Cognition Arousal/Alertness: Awake/alert Behavior During Therapy: WFL for tasks assessed/performed Overall Cognitive Status: Within Functional Limits for tasks assessed  Exercises      General Comments        Pertinent Vitals/Pain Pain Assessment Pain Assessment: 0-10 Pain Score: 4  Pain Location: left hip Pain Descriptors / Indicators: Aching, Burning, Guarding Pain Intervention(s): Premedicated before session, Monitored during session, Repositioned    Home Living                           Prior Function            PT Goals (current goals can now be found in the care plan section) Progress towards PT goals: Progressing toward goals    Frequency    BID      PT Plan Current plan remains appropriate    Co-evaluation              AM-PAC PT "6 Clicks" Mobility   Outcome Measure  Help needed turning from your back to your side while in a flat bed without using bedrails?: A Little Help needed moving from lying on your back to sitting on the side of a flat bed without using bedrails?: A Little Help needed moving to and from a bed to a chair (including a wheelchair)?: A Little Help needed standing up from a chair using your arms (e.g., wheelchair or bedside chair)?: A Little Help needed to walk in hospital room?: A Little Help needed climbing 3-5 steps with a railing? : A Little 6 Click Score: 18    End of Session Equipment Utilized During Treatment: Gait belt Activity Tolerance: Patient tolerated treatment well Patient left: in chair;with call bell/phone within reach;with chair alarm set Nurse Communication: Mobility status PT Visit Diagnosis: Muscle weakness (generalized) (M62.81);Difficulty in walking, not elsewhere classified (R26.2);Pain Pain - Right/Left: Left Pain - part of body: Hip     Time: 1003-1015 (and 10:44-11:10 with break for BM) PT Time Calculation (min) (ACUTE ONLY): 12 min  Charges:  $Gait Training: 23-37 mins $Therapeutic Activity: 8-22 mins                     D. Scott Branden Shallenberger PT, DPT 10/18/21, 11:29 AM

## 2021-10-19 LAB — SURGICAL PATHOLOGY

## 2022-06-17 ENCOUNTER — Encounter: Payer: Self-pay | Admitting: Urology

## 2022-09-07 ENCOUNTER — Other Ambulatory Visit: Payer: Managed Care, Other (non HMO)

## 2022-09-09 ENCOUNTER — Other Ambulatory Visit: Payer: Managed Care, Other (non HMO)

## 2022-09-14 ENCOUNTER — Ambulatory Visit: Payer: Managed Care, Other (non HMO) | Admitting: Urology

## 2022-09-22 ENCOUNTER — Other Ambulatory Visit: Payer: Self-pay | Admitting: *Deleted

## 2022-09-22 ENCOUNTER — Ambulatory Visit: Payer: Managed Care, Other (non HMO) | Admitting: Urology

## 2022-09-22 DIAGNOSIS — Z125 Encounter for screening for malignant neoplasm of prostate: Secondary | ICD-10-CM

## 2022-09-23 ENCOUNTER — Other Ambulatory Visit: Payer: Managed Care, Other (non HMO)

## 2022-09-23 DIAGNOSIS — Z125 Encounter for screening for malignant neoplasm of prostate: Secondary | ICD-10-CM

## 2022-09-24 LAB — PSA: Prostate Specific Ag, Serum: 3.3 ng/mL (ref 0.0–4.0)

## 2022-09-30 ENCOUNTER — Other Ambulatory Visit: Payer: Managed Care, Other (non HMO)

## 2022-10-03 ENCOUNTER — Ambulatory Visit: Payer: Managed Care, Other (non HMO) | Admitting: Urology

## 2022-10-03 ENCOUNTER — Encounter: Payer: Self-pay | Admitting: Urology

## 2022-10-03 VITALS — BP 164/84 | HR 67 | Ht 67.0 in | Wt 187.0 lb

## 2022-10-03 DIAGNOSIS — Z125 Encounter for screening for malignant neoplasm of prostate: Secondary | ICD-10-CM

## 2022-10-03 DIAGNOSIS — N529 Male erectile dysfunction, unspecified: Secondary | ICD-10-CM | POA: Diagnosis not present

## 2022-10-03 MED ORDER — TADALAFIL 20 MG PO TABS: 20.0000 mg | ORAL_TABLET | Freq: Every day | ORAL | 9 refills | Status: DC | PRN

## 2022-10-03 NOTE — Patient Instructions (Signed)

## 2022-10-03 NOTE — Progress Notes (Signed)
   10/03/2022 4:25 PM   Crosby Oyster 1955-12-26 [id number]  Reason for visit: Follow up PSA screening, ED, history of UTI  HPI: He is a healthy 67 year old male with no family history of prostate cancer who has a history of 2 mildly elevated PSAs of 4.27 and 4.25 in June and August 2020, as well as 2 UTIs around that time.  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  He opted for repeat PSA in December 2020 which dropped back down to normal at 2.9, and he opted for yearly screening.  DRE has been benign.  He denies any urinary symptoms.  PSA this year remains stable and normal at 3.3.  We again reviewed the AUA guidelines that recommend PSA screening every 1 to 2 years up until age 21.  In terms of his ED, he is taking the Cialis 10 to 20 mg on demand with good results.  He really is using the 20 mg dose more often.  Cialis refilled, increased to 20 mg as needed RTC 1 year PSA prior   [name], MD  [facility] [address] [place], [place] [address] [phone]

## 2022-11-03 ENCOUNTER — Other Ambulatory Visit: Payer: Self-pay

## 2022-11-03 DIAGNOSIS — N529 Male erectile dysfunction, unspecified: Secondary | ICD-10-CM

## 2022-11-03 MED ORDER — TADALAFIL 20 MG PO TABS: 20.0000 mg | ORAL_TABLET | Freq: Every day | ORAL | 9 refills | Status: DC | PRN

## 2022-11-03 NOTE — Telephone Encounter (Signed)
Pt requests RX be sent to mail order pharmacy. RX sent.

## 2022-12-23 ENCOUNTER — Ambulatory Visit (INDEPENDENT_AMBULATORY_CARE_PROVIDER_SITE_OTHER): Payer: Managed Care, Other (non HMO)

## 2022-12-23 ENCOUNTER — Other Ambulatory Visit: Payer: Self-pay | Admitting: Internal Medicine

## 2022-12-23 DIAGNOSIS — D123 Benign neoplasm of transverse colon: Secondary | ICD-10-CM | POA: Diagnosis not present

## 2022-12-23 DIAGNOSIS — Z86010 Personal history of colonic polyps: Secondary | ICD-10-CM | POA: Diagnosis present

## 2022-12-23 DIAGNOSIS — K640 First degree hemorrhoids: Secondary | ICD-10-CM | POA: Diagnosis not present

## 2022-12-23 DIAGNOSIS — K5730 Diverticulosis of large intestine without perforation or abscess without bleeding: Secondary | ICD-10-CM | POA: Diagnosis not present

## 2022-12-27 LAB — SURGICAL PATHOLOGY

## 2023-09-29 ENCOUNTER — Other Ambulatory Visit: Payer: Managed Care, Other (non HMO)

## 2023-09-29 DIAGNOSIS — Z125 Encounter for screening for malignant neoplasm of prostate: Secondary | ICD-10-CM

## 2023-09-29 DIAGNOSIS — N529 Male erectile dysfunction, unspecified: Secondary | ICD-10-CM

## 2023-09-30 LAB — PSA: Prostate Specific Ag, Serum: 3.2 ng/mL (ref 0.0–4.0)

## 2023-10-02 ENCOUNTER — Other Ambulatory Visit: Payer: Managed Care, Other (non HMO)

## 2023-10-04 ENCOUNTER — Ambulatory Visit: Payer: Managed Care, Other (non HMO) | Admitting: Urology

## 2023-10-05 ENCOUNTER — Ambulatory Visit: Payer: Managed Care, Other (non HMO) | Admitting: Urology

## 2023-10-05 DIAGNOSIS — N529 Male erectile dysfunction, unspecified: Secondary | ICD-10-CM

## 2023-10-05 DIAGNOSIS — Z125 Encounter for screening for malignant neoplasm of prostate: Secondary | ICD-10-CM | POA: Diagnosis not present

## 2023-10-05 MED ORDER — TADALAFIL 20 MG PO TABS: 20.0000 mg | ORAL_TABLET | Freq: Every day | ORAL | 11 refills | Status: DC | PRN

## 2023-10-05 NOTE — Patient Instructions (Signed)

## 2023-10-05 NOTE — Progress Notes (Signed)
   10/05/2023 3:45 PM   Brandon Galvan 09-01-1956 [id number]  Reason for visit: Follow up PSA screening, ED, history of UTI  HPI: Healthy 68 year old male with no family history of prostate cancer who we have followed for variable PSA values as well as ED.  He has a history of 2 mildly elevated PSA values of 4.27 and 4.25 in June and August 2020, and actually had 2 UTIs around that time as well.  Further evaluation of his infections with a renal ultrasound was benign aside from a nonobstructing 5 mm left lower pole stone.  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  He opted for repeat PSA in December 2020 which dropped back down to normal at 2.9 and he opted to continue yearly screening.  DRE has been benign.  Most recent PSA from 09/29/2023 is stable and normal at 3.2 from 3.3 last year.  He denies any urinary symptoms, urinalysis in September 2024 with PCP was benign.  He has ED responsive to Cialis 20 mg on demand, denies any changes over the last year.  Cialis refilled, RTC 1 year PSA prior, PVR   Sondra Come, MD  [facility] [address] [place], [place] [address] [phone]

## 2023-11-17 ENCOUNTER — Telehealth: Payer: Self-pay | Admitting: Urology

## 2023-11-17 DIAGNOSIS — N529 Male erectile dysfunction, unspecified: Secondary | ICD-10-CM

## 2023-11-17 MED ORDER — TADALAFIL 20 MG PO TABS: 20.0000 mg | ORAL_TABLET | Freq: Every day | ORAL | 3 refills | Status: DC | PRN

## 2023-11-17 NOTE — Telephone Encounter (Signed)
 He came into the office and had [place] change his pharmacy to [facility]. He wants his Tadilifil sent to them.

## 2023-11-17 NOTE — Telephone Encounter (Signed)
 Rx sent

## 2024-02-14 ENCOUNTER — Observation Stay

## 2024-02-14 ENCOUNTER — Emergency Department

## 2024-02-14 ENCOUNTER — Other Ambulatory Visit: Payer: Self-pay

## 2024-02-14 ENCOUNTER — Observation Stay
Admission: EM | Admit: 2024-02-14 | Discharge: 2024-02-15 | Disposition: A | Discharge status: Home / Self Care | Attending: Internal Medicine | Admitting: Internal Medicine

## 2024-02-14 ENCOUNTER — Observation Stay
Admit: 2024-02-14 | Discharge: 2024-02-14 | Disposition: A | Discharge status: Home / Self Care | Attending: Internal Medicine

## 2024-02-14 DIAGNOSIS — R531 Weakness: Secondary | ICD-10-CM | POA: Insufficient documentation

## 2024-02-14 DIAGNOSIS — I6389 Other cerebral infarction: Secondary | ICD-10-CM

## 2024-02-14 DIAGNOSIS — R001 Bradycardia, unspecified: Secondary | ICD-10-CM | POA: Insufficient documentation

## 2024-02-14 DIAGNOSIS — H53462 Homonymous bilateral field defects, left side: Secondary | ICD-10-CM | POA: Diagnosis not present

## 2024-02-14 DIAGNOSIS — I63531 Cerebral infarction due to unspecified occlusion or stenosis of right posterior cerebral artery: Secondary | ICD-10-CM | POA: Diagnosis not present

## 2024-02-14 DIAGNOSIS — R29703 NIHSS score 3: Secondary | ICD-10-CM

## 2024-02-14 DIAGNOSIS — I10 Essential (primary) hypertension: Secondary | ICD-10-CM | POA: Diagnosis not present

## 2024-02-14 DIAGNOSIS — I63431 Cerebral infarction due to embolism of right posterior cerebral artery: Secondary | ICD-10-CM

## 2024-02-14 DIAGNOSIS — R739 Hyperglycemia, unspecified: Secondary | ICD-10-CM | POA: Diagnosis not present

## 2024-02-14 DIAGNOSIS — Z96643 Presence of artificial hip joint, bilateral: Secondary | ICD-10-CM | POA: Insufficient documentation

## 2024-02-14 DIAGNOSIS — R200 Anesthesia of skin: Secondary | ICD-10-CM | POA: Diagnosis present

## 2024-02-14 DIAGNOSIS — Z79899 Other long term (current) drug therapy: Secondary | ICD-10-CM | POA: Diagnosis not present

## 2024-02-14 DIAGNOSIS — Z87891 Personal history of nicotine dependence: Secondary | ICD-10-CM | POA: Diagnosis not present

## 2024-02-14 DIAGNOSIS — I639 Cerebral infarction, unspecified: Principal | ICD-10-CM | POA: Diagnosis present

## 2024-02-14 LAB — DIFFERENTIAL
Abs Immature Granulocytes: 0.03 10*3/uL (ref 0.00–0.07)
Basophils Absolute: 0 10*3/uL (ref 0.0–0.1)
Basophils Relative: 1 %
Eosinophils Absolute: 0.3 10*3/uL (ref 0.0–0.5)
Eosinophils Relative: 5 %
Immature Granulocytes: 1 %
Lymphocytes Relative: 28 %
Lymphs Abs: 1.5 10*3/uL (ref 0.7–4.0)
Monocytes Absolute: 0.6 10*3/uL (ref 0.1–1.0)
Monocytes Relative: 11 %
Neutro Abs: 3 10*3/uL (ref 1.7–7.7)
Neutrophils Relative %: 54 %

## 2024-02-14 LAB — ETHANOL: Alcohol, Ethyl (B): <15 mg/dL (ref <15)

## 2024-02-14 LAB — COMPREHENSIVE METABOLIC PANEL WITH GFR
ALT: 22 U/L (ref 0–44)
AST: 20 U/L (ref 15–41)
Albumin: 3.7 g/dL (ref 3.5–5.0)
Alkaline Phosphatase: 50 U/L (ref 38–126)
Anion gap: 9 (ref 5–15)
BUN: 16 mg/dL (ref 8–23)
CO2: 24 mmol/L (ref 22–32)
Calcium: 9 mg/dL (ref 8.9–10.3)
Chloride: 106 mmol/L (ref 98–111)
Creatinine, Ser: 0.84 mg/dL (ref 0.61–1.24)
GFR, Estimated: >60 mL/min (ref >60)
Glucose, Bld: 152 mg/dL — ABNORMAL HIGH (ref 70–99)
Potassium: 3.9 mmol/L (ref 3.5–5.1)
Sodium: 139 mmol/L (ref 135–145)
Total Bilirubin: 0.6 mg/dL (ref 0.0–1.2)
Total Protein: 6.4 g/dL — ABNORMAL LOW (ref 6.5–8.1)

## 2024-02-14 LAB — CBC
HCT: 43.9 % (ref 39.0–52.0)
Hemoglobin: 15 g/dL (ref 13.0–17.0)
MCH: 32.8 pg (ref 26.0–34.0)
MCHC: 34.2 g/dL (ref 30.0–36.0)
MCV: 95.9 fL (ref 80.0–100.0)
Platelets: 222 10*3/uL (ref 150–400)
RBC: 4.58 MIL/uL (ref 4.22–5.81)
RDW: 13 % (ref 11.5–15.5)
WBC: 5.4 10*3/uL (ref 4.0–10.5)
nRBC: 0 % (ref 0.0–0.2)

## 2024-02-14 LAB — HIV ANTIBODY (ROUTINE TESTING W REFLEX): HIV Screen 4th Generation wRfx: NONREACTIVE

## 2024-02-14 LAB — APTT: aPTT: 27 s (ref 24–36)

## 2024-02-14 LAB — HEMOGLOBIN A1C
Hgb A1c MFr Bld: 4.8 % (ref 4.8–5.6)
Mean Plasma Glucose: 91.06 mg/dL

## 2024-02-14 LAB — PROTIME-INR
INR: 1 (ref 0.8–1.2)
Prothrombin Time: 13.8 s (ref 11.4–15.2)

## 2024-02-14 LAB — CBG MONITORING, ED: Glucose-Capillary: 154 mg/dL — ABNORMAL HIGH (ref 70–99)

## 2024-02-14 MED ORDER — ENOXAPARIN SODIUM 40 MG/0.4ML IJ SOSY
40.0000 mg | PREFILLED_SYRINGE | INTRAMUSCULAR | Status: DC
  Administered 2024-02-14: 40 mg via SUBCUTANEOUS
  Filled 2024-02-14: qty 0.4

## 2024-02-14 MED ORDER — HYDRALAZINE HCL 20 MG/ML IJ SOLN: 5.0000 mg | Freq: Four times a day (QID) | INTRAMUSCULAR | Status: DC | PRN

## 2024-02-14 MED ORDER — BRIMONIDINE TARTRATE-TIMOLOL 0.2-0.5 % OP SOLN
1.0000 [drp] | Freq: Two times a day (BID) | OPHTHALMIC | Status: DC
  Filled 2024-02-14: qty 5

## 2024-02-14 MED ORDER — ACETAMINOPHEN 325 MG PO TABS: 650.0000 mg | ORAL_TABLET | ORAL | Status: DC | PRN

## 2024-02-14 MED ORDER — IOHEXOL 350 MG/ML SOLN
100.0000 mL | Freq: Once | INTRAVENOUS | Status: AC | PRN
  Administered 2024-02-14: 100 mL via INTRAVENOUS

## 2024-02-14 MED ORDER — STROKE: EARLY STAGES OF RECOVERY BOOK: Freq: Once | Status: DC

## 2024-02-14 MED ORDER — SODIUM CHLORIDE 0.9% FLUSH: 3.0000 mL | INTRAVENOUS | Status: DC | PRN

## 2024-02-14 MED ORDER — CLOPIDOGREL BISULFATE 75 MG PO TABS
75.0000 mg | ORAL_TABLET | Freq: Every day | ORAL | Status: DC
  Administered 2024-02-15: 75 mg via ORAL
  Filled 2024-02-14: qty 1

## 2024-02-14 MED ORDER — SENNOSIDES-DOCUSATE SODIUM 8.6-50 MG PO TABS: 1.0000 | ORAL_TABLET | Freq: Every evening | ORAL | Status: DC | PRN

## 2024-02-14 MED ORDER — ASPIRIN 81 MG PO TBEC
81.0000 mg | DELAYED_RELEASE_TABLET | Freq: Every day | ORAL | Status: DC
  Administered 2024-02-15: 81 mg via ORAL
  Filled 2024-02-14: qty 1

## 2024-02-14 MED ORDER — ATORVASTATIN CALCIUM 20 MG PO TABS
80.0000 mg | ORAL_TABLET | Freq: Every day | ORAL | Status: DC
  Administered 2024-02-14 – 2024-02-15 (×2): 80 mg via ORAL
  Filled 2024-02-14 (×2): qty 4

## 2024-02-14 MED ORDER — BRIMONIDINE TARTRATE 0.2 % OP SOLN
1.0000 [drp] | Freq: Two times a day (BID) | OPHTHALMIC | Status: DC
  Administered 2024-02-14 – 2024-02-15 (×2): 1 [drp] via OPHTHALMIC
  Filled 2024-02-14: qty 5

## 2024-02-14 MED ORDER — SODIUM CHLORIDE 0.9% FLUSH
3.0000 mL | Freq: Two times a day (BID) | INTRAVENOUS | Status: DC
  Administered 2024-02-14: 3 mL via INTRAVENOUS
  Administered 2024-02-15: 10 mL via INTRAVENOUS

## 2024-02-14 MED ORDER — ACETAMINOPHEN 160 MG/5ML PO SOLN: 650.0000 mg | ORAL | Status: DC | PRN

## 2024-02-14 MED ORDER — ACETAMINOPHEN 650 MG RE SUPP
650.0000 mg | RECTAL | Status: DC | PRN
Start: 2024-02-14 — End: 2024-02-15

## 2024-02-14 MED ORDER — CLOPIDOGREL BISULFATE 75 MG PO TABS
300.0000 mg | ORAL_TABLET | Freq: Once | ORAL | Status: AC
  Administered 2024-02-14: 300 mg via ORAL
  Filled 2024-02-14: qty 4

## 2024-02-14 MED ORDER — TIMOLOL MALEATE 0.5 % OP SOLN
1.0000 [drp] | Freq: Two times a day (BID) | OPHTHALMIC | Status: DC
  Administered 2024-02-14 – 2024-02-15 (×2): 1 [drp] via OPHTHALMIC
  Filled 2024-02-14: qty 5

## 2024-02-14 MED ORDER — ASPIRIN 325 MG PO TABS
650.0000 mg | ORAL_TABLET | Freq: Once | ORAL | Status: AC
  Administered 2024-02-14: 650 mg via ORAL
  Filled 2024-02-14: qty 2

## 2024-02-14 MED ORDER — LORAZEPAM 0.5 MG PO TABS: 0.5000 mg | ORAL_TABLET | Freq: Four times a day (QID) | ORAL | Status: DC | PRN

## 2024-02-14 NOTE — Progress Notes (Signed)
  Echocardiogram 2D Echocardiogram has been performed.  Tabias Swayze L Kainan Patty RDCS 02/14/2024, 4:03 PM

## 2024-02-14 NOTE — ED Notes (Signed)
 Code  stroke  called  to  [facility]  per  DR  RAY  MD

## 2024-02-14 NOTE — ED Triage Notes (Addendum)
 Pt to ED via POV from home. Pt reports past 2 days has been having left sided facial, hand, leg and foot numbness. Pt reports numbness intensified yesterday after a migraine around noon. Pt reports intermittent vision changes with hx of glaucoma. No speech changes or N/V/D. No blood thinner.

## 2024-02-14 NOTE — H&P (Signed)
 History and Physical    Brandon Galvan ZOX:[id number] DOB: 11-Jan-1956 DOA: 02/14/2024  PCP: Sari Cunning, MD (Confirm with patient/family/NH records and if not entered, this has to be entered at [facility] point of entry) Patient coming from: Home  I have personally briefly reviewed patient's old medical records in [facility]  Chief Complaint: Left-sided numbness, left hand weakness and clumsy  HPI: Brandon Galvan is a 68 y.o. male with medical history significant of glaucoma, presented with strokelike symptoms including new onset of left-sided numbness and left hand weakness and clumsy.  Symptoms started yesterday afternoon, after lunchtime, patient started to have unsteady gait, and some blurry vision, soon followed by left face numbness which progressed to whole left side in few hours including left arm left hand and left leg and he had unsteady gait but no fall.  Because of the swelling and left-sided numbness persisted, he also found left hand very clumsy and difficulty to complete fine movement like picking up eyedrop bottle.  No headache, denied any double vision.  ED Course: Afebrile, bradycardia heart rate in the 46-56 level.  Blood pressure SBP 160s.  CT head negative for acute findings.  CTA positive for occlusion of right PCA P1/P2 junction.  Blood work showed hemoglobin 15 BUN 16 creatinine 0.8 glucose 152 K3.9.  Patient was given aspirin  Plavix  loading dose.  Review of Systems: As per HPI otherwise 14 point review of systems negative.    Past Medical History:  Diagnosis Date   Arthritis    right hip   Depression    h/o   Family history of diabetes mellitus    History of kidney stones    still has a kidney stone as of 04-16-19    Hyperlipidemia    Kidney stone     Past Surgical History:  Procedure Laterality Date   CATARACT EXTRACTION W/PHACO Right 08/23/2021   Procedure: CATARACT EXTRACTION PHACO AND INTRAOCULAR LENS PLACEMENT (IOC) RIGHT;  Surgeon: Rosa College, MD;  Location: [facility];  Service: Ophthalmology;  Laterality: Right;  LEAVE ARRIVAL 8 3.83 00:32.1   CATARACT EXTRACTION W/PHACO Left 09/06/2021   Procedure: CATARACT EXTRACTION PHACO AND INTRAOCULAR LENS PLACEMENT (IOC) LEFT;  Surgeon: Rosa College, MD;  Location: [facility];  Service: Ophthalmology;  Laterality: Left;  2.23 00:25.8   COLONOSCOPY WITH PROPOFOL  N/A 06/15/2017   Procedure: COLONOSCOPY WITH PROPOFOL ;  Surgeon: Luke Salaam, MD;  Location: [facility] ENDOSCOPY;  Service: Gastroenterology;  Laterality: N/A;   LITHOTRIPSY     TOTAL HIP ARTHROPLASTY Right 04/25/2019   Procedure: TOTAL HIP ARTHROPLASTY ANTERIOR APPROACH;  Surgeon: Molli Angelucci, MD;  Location: [facility] ORS;  Service: Orthopedics;  Laterality: Right;   TOTAL HIP ARTHROPLASTY Left 10/15/2021   Procedure: TOTAL HIP ARTHROPLASTY ANTERIOR APPROACH;  Surgeon: Molli Angelucci, MD;  Location: [facility] ORS;  Service: Orthopedics;  Laterality: Left;     reports that he quit smoking about 14 years ago. His smoking use included cigars. He has never used smokeless tobacco. He reports current alcohol use of about 13.0 standard drinks of alcohol per week. He reports that he does not use drugs.  Allergies  Allergen Reactions   Penicillin G Anaphylaxis, Hives and Swelling    Pt verbalizes swelling to lips and hives on abd Did it involve swelling of the face/tongue/throat, SOB, or low BP? Yes Did it involve sudden or severe rash/hives, skin peeling, or any reaction on the inside of your mouth or nose? yes Did you need to  seek medical attention at a hospital or doctor's office? Yes When did it last happen?   2010    If all above answers are "NO", may proceed with cephalosporin use.    Amoxicillin Rash   Benadryl [Diphenhydramine] Rash    Family History  Problem Relation Age of Onset   Osteoporosis Mother    Arthritis Mother    Dementia Mother    Hypertension Father    Diabetes Father    Diabetes  Sister    Hypertension Sister    Healthy Daughter    Healthy Son    Healthy Daughter    Healthy Daughter      Prior to Admission medications   Medication Sig Start Date End Date Taking? Authorizing Provider  brimonidine -timolol  (COMBIGAN ) 0.2-0.5 % ophthalmic solution Place 1 drop into both eyes every 12 (twelve) hours.    [provider]  tadalafil  (CIALIS ) 20 MG tablet Take 1 tablet (20 mg total) by mouth daily as needed for erectile dysfunction. 11/17/23   Lawerence Pressman, MD    Physical Exam: Vitals:   02/14/24 0826 02/14/24 1000  BP: (!) 146/83   Pulse: 80   Resp: 20   Temp: 97.9 F (36.6 C)   TempSrc: Oral   SpO2: 97%   Height:  5\' 7"  (1.702 m)    Constitutional: NAD, calm, comfortable Vitals:   02/14/24 0826 02/14/24 1000  BP: (!) 146/83   Pulse: 80   Resp: 20   Temp: 97.9 F (36.6 C)   TempSrc: Oral   SpO2: 97%   Height:  5\' 7"  (1.702 m)   Eyes: PERRL, lids and conjunctivae normal ENMT: Mucous membranes are moist. Posterior pharynx clear of any exudate or lesions.Normal dentition.  Neck: normal, supple, no masses, no thyromegaly Respiratory: clear to auscultation bilaterally, no wheezing, no crackles. Normal respiratory effort. No accessory muscle use.  Cardiovascular: Regular rate and rhythm, no murmurs / rubs / gallops. No extremity edema. 2+ pedal pulses. No carotid bruits.  Abdomen: no tenderness, no masses palpated. No hepatosplenomegaly. Bowel sounds positive.  Musculoskeletal: no clubbing / cyanosis. No joint deformity upper and lower extremities. Good ROM, no contractures. Normal muscle tone.  Skin: no rashes, lesions, ulcers. No induration Neurologic: Visual defect in the left upper quadrant.  Decreased left-sided light touch sensation, DTR normal. Strength 5/5 in all 4.  Psychiatric: Normal judgment and insight. Alert and oriented x 3. Normal mood.     Labs on Admission: I have personally reviewed following labs and imaging  studies  CBC: Recent Labs  Lab 02/14/24 0857  WBC 5.4  NEUTROABS 3.0  HGB 15.0  HCT 43.9  MCV 95.9  PLT 222   Basic Metabolic Panel: Recent Labs  Lab 02/14/24 0857  NA 139  K 3.9  CL 106  CO2 24  GLUCOSE 152*  BUN 16  CREATININE 0.84  CALCIUM 9.0   GFR: CrCl cannot be calculated (Unknown ideal weight.). Liver Function Tests: Recent Labs  Lab 02/14/24 0857  AST 20  ALT 22  ALKPHOS 50  BILITOT 0.6  PROT 6.4*  ALBUMIN 3.7   No results for input(s): "LIPASE", "AMYLASE" in the last 168 hours. No results for input(s): "AMMONIA" in the last 168 hours. Coagulation Profile: Recent Labs  Lab 02/14/24 0857  INR 1.0   Cardiac Enzymes: No results for input(s): "CKTOTAL", "CKMB", "CKMBINDEX", "TROPONINI" in the last 168 hours. BNP (last 3 results) No results for input(s): "PROBNP" in the last 8760 hours. HbA1C: No results for input(s): "  HGBA1C" in the last 72 hours. CBG: Recent Labs  Lab 02/14/24 0856  GLUCAP 154*   Lipid Profile: No results for input(s): "CHOL", "HDL", "LDLCALC", "TRIG", "CHOLHDL", "LDLDIRECT" in the last 72 hours. Thyroid Function Tests: No results for input(s): "TSH", "T4TOTAL", "FREET4", "T3FREE", "THYROIDAB" in the last 72 hours. Anemia Panel: No results for input(s): "VITAMINB12", "FOLATE", "FERRITIN", "TIBC", "IRON", "RETICCTPCT" in the last 72 hours. Urine analysis:    Component Value Date/Time   COLORURINE YELLOW (A) 10/08/2021 0846   APPEARANCEUR CLEAR (A) 10/08/2021 0846   APPEARANCEUR Clear 06/13/2019 1352   LABSPEC 1.018 10/08/2021 0846   PHURINE 5.0 10/08/2021 0846   GLUCOSEU NEGATIVE 10/08/2021 0846   HGBUR NEGATIVE 10/08/2021 0846   BILIRUBINUR NEGATIVE 10/08/2021 0846   BILIRUBINUR Negative 06/13/2019 1352   KETONESUR 5 (A) 10/08/2021 0846   PROTEINUR NEGATIVE 10/08/2021 0846   UROBILINOGEN 0.2 01/11/2018 1343   NITRITE NEGATIVE 10/08/2021 0846   LEUKOCYTESUR NEGATIVE 10/08/2021 0846    Radiological Exams on  Admission: CT ANGIO HEAD NECK W WO CM W PERF (CODE STROKE) Result Date: 02/14/2024 CLINICAL DATA:  68 year old male code stroke presentation. EXAM: CT ANGIOGRAPHY HEAD AND NECK CT PERFUSION BRAIN TECHNIQUE: Multidetector CT imaging of the head and neck was performed using the standard protocol during bolus administration of intravenous contrast. Multiplanar CT image reconstructions and MIPs were obtained to evaluate the vascular anatomy. Carotid stenosis measurements (when applicable) are obtained utilizing NASCET criteria, using the distal internal carotid diameter as the denominator. Multiphase CT imaging of the brain was performed following IV bolus contrast injection. Subsequent parametric perfusion maps were calculated using RAPID software. RADIATION DOSE REDUCTION: This exam was performed according to the departmental dose-optimization program which includes automated exposure control, adjustment of the mA and/or kV according to patient size and/or use of iterative reconstruction technique. CONTRAST:  OMNIPAQUE IOHEXOL 350 MG/ML SOLN COMPARISON:  Plain head CT 0902 hours today. FINDINGS: CT Brain Perfusion Findings: ASPECTS: 10 CBF (<30%) Volume: 0mL Perfusion (Tmax>6.0s) volume: 22mL.  Hypoperfusion index of zero. Mismatch Volume: 22mL Infarction Location:Right PCA CTA NECK Skeleton: Cervical spine degeneration. Hyperostosis appearing upper thoracic interbody ankylosis. No acute osseous abnormality identified. Upper chest: Negative aside from some mediastinal lipomatosis. Other neck: Nonvascular neck soft tissue spaces are within normal limits. Aortic arch: Aberrant origin of the right subclavian artery. Four vessel arch configuration. Mild arch atherosclerosis. Right carotid system: Patent with mild to moderate soft and calcified plaque at the posterior right ICA origin and bulb. No stenosis. Left carotid system: Patent with mild tortuosity. Minimal plaque at the left carotid bifurcation and no  stenosis. Vertebral arteries: Aberrant origin right subclavian artery with mild soft and calcified plaque, no stenosis. Patent right vertebral artery origin with no significant plaque or stenosis. Dominant appearance of the right vertebral artery to the skull base with no stenosis. Proximal left subclavian artery is mildly tortuous with mild plaque. Left vertebral artery origin appears both diminutive but also stenotic due to soft plaque on series 9, image 186. The left vertebral artery is patent but small to the skull base. CTA HEAD Posterior circulation: Left vertebral V4 segment appears occluded at the crossing of the dura, with bulky downstream calcified plaque. No reconstitution. Distal right vertebral artery is patent and supplies the basilar with mild irregularity and tortuosity at the vertebrobasilar junction, but no significant stenosis. Right PICA origin is patent. Tortuous basilar artery is patent, and the basilar tip is ectatic. SCA and PCA origins are patent without stenosis on series 12,  image 22. Small left posterior communicating artery is visible, the right is diminutive or absent. Left PCA branches are within normal limits. Right PCA is occluded in the P1/P2 junction seen on series 11, image 21. Little if any distal reconstitution. Anterior circulation: Both ICA siphons are patent. Asymmetric left cavernous sinus enhancement is probably physiologic. Ectatic appearance of the left ICA siphon with no significant plaque or stenosis. Ectatic right ICA siphon with no significant plaque or stenosis. Left posterior communicating artery origin appears normal. Patent carotid termini, normal MCA and ACA origins. Tortuous A1 segments. Diminutive or absent anterior communicating artery. Bilateral ACA branches are within normal limits. Left MCA M1 segment tortuous bifurcation without stenosis. Right MCA M1 segment and tortuous trifurcation without stenosis. Bilateral MCA branches are within normal limits.  Venous sinuses: Patent. Arachnoid granulation at the junction of the right transverse and sigmoid sinuses (physiologic). Anatomic variants: Affectively dominant right vertebral artery which supplies the basilar. Aberrant origin of the right subclavian artery. Review of the MIP images confirms the above findings Preliminary report of the CTA findings communicated to Dr. Arora at 9:18 am on 02/14/2024 by text page via the [facility] messaging system. IMPRESSION: 1. Positive for Right PCA Occlusion at the P1/P2 junction with associated 22 mL oligemia on CT Perfusion. No infarct core detected. 2. 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 3. Ectatic basilar and carotid arteries.  Mild carotid plaque. Electronically Signed   By: Marlise Simpers M.D.   On: 02/14/2024 09:27   CT HEAD CODE STROKE WO CONTRAST Result Date: 02/14/2024 CLINICAL DATA:  Code stroke.  68 year old male. EXAM: CT HEAD WITHOUT CONTRAST TECHNIQUE: Contiguous axial images were obtained from the base of the skull through the vertex without intravenous contrast. RADIATION DOSE REDUCTION: This exam was performed according to the departmental dose-optimization program which includes automated exposure control, adjustment of the mA and/or kV according to patient size and/or use of iterative reconstruction technique. COMPARISON:  Head CT 01/22/2021. FINDINGS: Brain: Cerebral volume is stable and normal for age. No midline shift, ventriculomegaly, mass effect, evidence of mass lesion, intracranial hemorrhage or evidence of cortically based acute infarction. Mild for age patchy periventricular white matter hypodensity appears stable, slightly greater in the left hemisphere. Vascular: Chronic generalized intracranial artery tortuosity. Calcified atherosclerosis at the skull base. No suspicious intracranial vascular hyperdensity. However, compared to 2022 the  distal left vertebral artery appears atretic now, with bulky chronic upstream calcified atherosclerosis (series 6, image 33). Skull: Intact, negative. Sinuses/Orbits: Visualized paranasal sinuses and mastoids are stable and well aerated. Other: Postoperative changes to both globes now. No acute orbit or scalp soft tissue finding. ASPECTS [facility] Stroke Program Early CT Score) Total score (0-10 with 10 being normal): 10 IMPRESSION: 1. No acute cortically based infarct or acute intracranial hemorrhage identified. ASPECTS 10. Stable chronic white matter changes. 2. Compared to 2022 the distal Left Vertebral Artery now appears atretic, with bulky chronic calcified atherosclerosis upstream. 3. These results were communicated to Dr. Bonnita Buttner at 9:11 am on 02/14/2024 by text page via the [facility] messaging system. Electronically Signed   By: Marlise Simpers M.D.   On: 02/14/2024 09:12    EKG: Independently reviewed.  Sinus bradycardia, no acute ST changes no acute PR or QTc interval changes.  Assessment/Plan Principal Problem:   Stroke [facility]) Active Problems:   Stroke (cerebrum) (HCC)  (please populate well all problems here in  Problem List. (For example, if patient is on BP meds at home and you resume or decide to hold them, it is a problem that needs to be her. Same for CAD, COPD, HLD and so on)  Right PCA stroke Left superior quadrantanopsia Left-sided paresthesia - Neurology consultation appreciated.  There was a discussion between neurology interventional radiology and patient regarding intervention of right PCA P1 and P2 junction occlusion, after knowing about possible side effect of bleeding, patient elected to not to proceed any IR intervention. - Permissive hypertension - Aspirin Plavix - Risk factor modification, A1c and lipid panel pending - PT OT evaluation - Echocardiogram - Telemonitoring x 24 hours  Elevated glucose - Check A1c to rule out diabetes  Sinus bradycardia - Chronic as per patient.   Blood pressure stable.  No indication for intervention  DVT prophylaxis: Lovenox  Code Status: Full code Family Communication: Wife at bedside Disposition Plan: Expect less than 2 midnight hospital Consults called: Neurology Admission status: [place] observation   [place] MD [facility] Pager [phone]  02/14/2024, 10:32 AM

## 2024-02-14 NOTE — Plan of Care (Signed)

## 2024-02-14 NOTE — Evaluation (Signed)
 Physical Therapy Evaluation Patient Details Name: Brandon Galvan MRN: [id number] DOB: Oct 01, 1955 Today's Date: 02/14/2024  History of Present Illness  Brandon Galvan is a 68 y.o. male with medical history significant of glaucoma, presented with strokelike symptoms including new onset of left-sided numbness and left hand weakness and clumsy.  Clinical Impression  Pt admitted with above diagnosis. Pt currently with functional limitations due to the deficits listed below (see PT Problem List). Pt received upright in bed pleasant and agreeable to PT. All testing normal with pt mobilizing independently with all functional mobility. Pt with good balance with ability to dynamically stop gait quickly and turn when given directions and safely completing stairs to access Master bed room and bathroom upstairs. Pt returns to recliner with all needs in reach. No mobility concerns from pt or family. No acute PT needs thus PT to sign off.      If plan is discharge home, recommend the following: Help with stairs or ramp for entrance   Can travel by private vehicle        Equipment Recommendations None recommended by PT  Recommendations for Other Services       Functional Status Assessment Patient has not had a recent decline in their functional status     Precautions / Restrictions Precautions Precautions: None Restrictions Weight Bearing Restrictions Per Provider Order: No      Mobility  Bed Mobility Overal bed mobility: Independent               Patient Response: Cooperative  Transfers Overall transfer level: Independent Equipment used: None                    Ambulation/Gait Ambulation/Gait assistance: Independent Gait Distance (Feet): 400 Feet Assistive device: None Gait Pattern/deviations: WFL(Within Functional Limits)          Stairs Stairs: Yes Stairs assistance: Independent Stair Management: No rails, Alternating pattern, Forwards Number of Stairs:  16 General stair comments: asc/desc 4 steps, x4  Wheelchair Mobility     Tilt Bed Tilt Bed Patient Response: Cooperative  Modified Rankin (Stroke Patients Only)       Balance Overall balance assessment: Independent                                           Pertinent Vitals/Pain Pain Assessment Pain Assessment: No/denies pain    Home Living Family/patient expects to be discharged to:: Private residence Living Arrangements: Spouse/significant other Available Help at Discharge: Family Type of Home: House Home Access: Stairs to enter Entrance Stairs-Rails: None Entrance Stairs-Number of Steps: 3-4 from back door Alternate Level Stairs-Number of Steps: flight Home Layout: Two level;1/2 bath on main level;Bed/bath upstairs Home Equipment: Agricultural consultant (2 wheels);Cane - single point;BSC/3in1      Prior Function Prior Level of Function : Independent/Modified Independent;Working/employed;Driving                     Extremity/Trunk Assessment   Upper Extremity Assessment Upper Extremity Assessment: Overall WFL for tasks assessed    Lower Extremity Assessment Lower Extremity Assessment: Overall WFL for tasks assessed       Communication   Communication Communication: No apparent difficulties    Cognition Arousal: Alert Behavior During Therapy: WFL for tasks assessed/performed   PT - Cognitive impairments: No apparent impairments  Following commands: Intact       Cueing Cueing Techniques: Verbal cues     General Comments      Exercises Other Exercises Other Exercises: HTS, FTN, RAMP, MMT in BUE and LE's all normal.   Assessment/Plan    PT Assessment Patient does not need any further PT services  PT Problem List         PT Treatment Interventions      PT Goals (Current goals can be found in the Care Plan section)  Acute Rehab PT Goals PT Goal Formulation: All assessment and education  complete, DC therapy    Frequency       Co-evaluation               AM-PAC PT "6 Clicks" Mobility  Outcome Measure Help needed turning from your back to your side while in a flat bed without using bedrails?: None Help needed moving from lying on your back to sitting on the side of a flat bed without using bedrails?: None Help needed moving to and from a bed to a chair (including a wheelchair)?: None Help needed standing up from a chair using your arms (e.g., wheelchair or bedside chair)?: None Help needed to walk in hospital room?: None Help needed climbing 3-5 steps with a railing? : A Little 6 Click Score: 23    End of Session   Activity Tolerance: Patient tolerated treatment well Patient left: in chair;with call bell/phone within reach;with family/visitor present Nurse Communication: Mobility status PT Visit Diagnosis: Other symptoms and signs involving the nervous system (R29.898)    Time: 6045-4098 PT Time Calculation (min) (ACUTE ONLY): 18 min   Charges:   PT Evaluation $PT Eval Low Complexity: 1 Low   PT General Charges $$ ACUTE PT VISIT: 1 Visit         Marc Senior. Fairly IV, PT, DPT Physical Therapist- [place]  [facility]  02/14/2024, 3:44 PM

## 2024-02-14 NOTE — Progress Notes (Signed)
 SLP Cancellation Note  Patient Details Name: FREDRICK GEOGHEGAN MRN: [id number] DOB: 1956/05/06   Cancelled treatment:       Reason Eval/Treat Not Completed: SLP screened, no needs identified, will sign off (chart reviewed; consulted NSG and met w/ pt/Family in room.)  Per chart notes, pt is a 68 y.o. male with medical history significant of glaucoma, Hyperlipidemia who presented with strokelike symptoms including new onset of left-sided numbness and left hand weakness, blurry vision, and clumsy. Currently, he continues to experience LUE numbness and min left upper labial numbness; no gross facial asymmetry observed.  Pt is A/O x4 and talkative. Just returned from test.   Pt denied any difficulty swallowing and is currently on a regular diet; tolerates swallowing pills w/ water per NSG. Pt stated he just finished a "pizza" w/out difficulty chewing/swallowing.  Pt conversed in Full conversation w/out expressive/receptive deficits noted; pt denied any speech-language deficits. Speech clear, intelligible.   No further skilled ST services indicated as pt appears at his baseline. Pt agreed. NSG to reconsult if any change in status while admitted.       Darla Edward, MS, CCC-SLP Speech Language Pathologist Rehab Services; [facility] [phone] (ascom) Angelie Kram 02/14/2024, 2:26 PM

## 2024-02-14 NOTE — Progress Notes (Signed)
 CODE STROKE- PHARMACY COMMUNICATION   Time CODE STROKE called/page received: 0846  Time response to CODE STROKE was made (in person or via phone): Immediately  Time Stroke Kit retrieved from [employer] (only if needed): N/A, outside of window  Name of Provider/Nurse contacted: Dr. Marisa Sickles 02/14/2024  9:07 AM

## 2024-02-14 NOTE — ED Provider Notes (Signed)
 [facility] Provider Note    Event Date/Time   First MD Initiated Contact with Patient 02/14/24 [phone]     (approximate)   History   Numbness   HPI  Brandon Galvan is a 68 year old male presenting to the emergency department for evaluation of left-sided paresthesias.  Yesterday sometime between noon and 2 PM, patient had onset of mild tingling around his left lip.  By 4 PM he noticed tingling of his left face, arm, leg.  Of note, reported numbness in triage, but patient clarifies that this is a pins and needle sensation not true absence of sensation.  Further clarifies that on Monday he had a headache which is atypical for him, but this had resolved and he did not onset of paresthesias until noon yesterday at the earliest.  Did note some visual difficulty in his left side of his vision last night different than his normal vision problems related to his glaucoma.  No speech changes, nausea, vomiting, anticoagulation use.     Physical Exam   Triage Vital Signs: ED Triage Vitals  Encounter Vitals Group     BP 02/14/24 0826 (!) 146/83     Systolic BP Percentile --      Diastolic BP Percentile --      Pulse Rate 02/14/24 0826 80     Resp 02/14/24 0826 20     Temp 02/14/24 0826 97.9 F (36.6 C)     Temp Source 02/14/24 0826 Oral     SpO2 02/14/24 0826 97 %     Weight --      Height --      Head Circumference --      Peak Flow --      Pain Score 02/14/24 0828 0     Pain Loc --      Pain Education --      Exclude from Growth Chart --     Most recent vital signs: Vitals:   02/14/24 0826  BP: (!) 146/83  Pulse: 80  Resp: 20  Temp: 97.9 F (36.6 C)  SpO2: 97%     General: Awake, interactive  CV:  Regular rate, good peripheral perfusion.  Resp:  Unlabored respirations.  Abd:  Nondistended.  Neuro:  Keenly aware, correctly answers month and age, able to blink eyes and squeeze hands, normal horizontal extraocular movements, field cut of the left  upper visual field, remainder of visual fields intact, normal facial symmetry, drift with left arm but does not hit bed, mild drift of left leg, does not hit bed, no drift of right arm and leg, no limb ataxia, normal sensation, no aphasia, no dysarthria, no inattention. NIH 4    ED Results / Procedures / Treatments   Labs (all labs ordered are listed, but only abnormal results are displayed) Labs Reviewed  COMPREHENSIVE METABOLIC PANEL WITH GFR - Abnormal; Notable for the following components:      Result Value   Glucose, Bld 152 (*)    Total Protein 6.4 (*)    All other components within normal limits  CBG MONITORING, ED - Abnormal; Notable for the following components:   Glucose-Capillary 154 (*)    All other components within normal limits  PROTIME-INR  APTT  CBC  DIFFERENTIAL  ETHANOL  URINE DRUG SCREEN, QUALITATIVE ([facility] ONLY)     EKG EKG independently reviewed interpreted by myself (ER attending) demonstrates:  EKG demonstrate sinus rhythm at a rate of 57, PR 158, QRS 121, QTc 404, no  acute ST changes  RADIOLOGY Imaging independently reviewed and interpreted by myself demonstrates:  CT head without acute bleed CTA head and neck does demonstrate a right PCA occlusion with perfusion defect  Formal Radiology Read:  CT ANGIO HEAD NECK W WO CM W PERF (CODE STROKE) Result Date: 02/14/2024 CLINICAL DATA:  68 year old male code stroke presentation. EXAM: CT ANGIOGRAPHY HEAD AND NECK CT PERFUSION BRAIN TECHNIQUE: Multidetector CT imaging of the head and neck was performed using the standard protocol during bolus administration of intravenous contrast. Multiplanar CT image reconstructions and MIPs were obtained to evaluate the vascular anatomy. Carotid stenosis measurements (when applicable) are obtained utilizing NASCET criteria, using the distal internal carotid diameter as the denominator. Multiphase CT imaging of the brain was performed following IV bolus contrast injection.  Subsequent parametric perfusion maps were calculated using RAPID software. RADIATION DOSE REDUCTION: This exam was performed according to the departmental dose-optimization program which includes automated exposure control, adjustment of the mA and/or kV according to patient size and/or use of iterative reconstruction technique. CONTRAST:  OMNIPAQUE IOHEXOL 350 MG/ML SOLN COMPARISON:  Plain head CT 0902 hours today. FINDINGS: CT Brain Perfusion Findings: ASPECTS: 10 CBF (<30%) Volume: 0mL Perfusion (Tmax>6.0s) volume: 22mL.  Hypoperfusion index of zero. Mismatch Volume: 22mL Infarction Location:Right PCA CTA NECK Skeleton: Cervical spine degeneration. Hyperostosis appearing upper thoracic interbody ankylosis. No acute osseous abnormality identified. Upper chest: Negative aside from some mediastinal lipomatosis. Other neck: Nonvascular neck soft tissue spaces are within normal limits. Aortic arch: Aberrant origin of the right subclavian artery. Four vessel arch configuration. Mild arch atherosclerosis. Right carotid system: Patent with mild to moderate soft and calcified plaque at the posterior right ICA origin and bulb. No stenosis. Left carotid system: Patent with mild tortuosity. Minimal plaque at the left carotid bifurcation and no stenosis. Vertebral arteries: Aberrant origin right subclavian artery with mild soft and calcified plaque, no stenosis. Patent right vertebral artery origin with no significant plaque or stenosis. Dominant appearance of the right vertebral artery to the skull base with no stenosis. Proximal left subclavian artery is mildly tortuous with mild plaque. Left vertebral artery origin appears both diminutive but also stenotic due to soft plaque on series 9, image 186. The left vertebral artery is patent but small to the skull base. CTA HEAD Posterior circulation: Left vertebral V4 segment appears occluded at the crossing of the dura, with bulky downstream calcified plaque. No  reconstitution. Distal right vertebral artery is patent and supplies the basilar with mild irregularity and tortuosity at the vertebrobasilar junction, but no significant stenosis. Right PICA origin is patent. Tortuous basilar artery is patent, and the basilar tip is ectatic. SCA and PCA origins are patent without stenosis on series 12, image 22. Small left posterior communicating artery is visible, the right is diminutive or absent. Left PCA branches are within normal limits. Right PCA is occluded in the P1/P2 junction seen on series 11, image 21. Little if any distal reconstitution. Anterior circulation: Both ICA siphons are patent. Asymmetric left cavernous sinus enhancement is probably physiologic. Ectatic appearance of the left ICA siphon with no significant plaque or stenosis. Ectatic right ICA siphon with no significant plaque or stenosis. Left posterior communicating artery origin appears normal. Patent carotid termini, normal MCA and ACA origins. Tortuous A1 segments. Diminutive or absent anterior communicating artery. Bilateral ACA branches are within normal limits. Left MCA M1 segment tortuous bifurcation without stenosis. Right MCA M1 segment and tortuous trifurcation without stenosis. Bilateral MCA branches are within normal limits.  Venous sinuses: Patent. Arachnoid granulation at the junction of the right transverse and sigmoid sinuses (physiologic). Anatomic variants: Affectively dominant right vertebral artery which supplies the basilar. Aberrant origin of the right subclavian artery. Review of the MIP images confirms the above findings Preliminary report of the CTA findings communicated to Dr. Arora at 9:18 am on 02/14/2024 by text page via the [facility] messaging system. IMPRESSION: 1. Positive for Right PCA Occlusion at the P1/P2 junction with associated 22 mL oligemia on CT Perfusion. No infarct core detected. 2. 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 3. Ectatic basilar and carotid arteries.  Mild carotid plaque. Electronically Signed   By: Marlise Simpers M.D.   On: 02/14/2024 09:27   CT HEAD CODE STROKE WO CONTRAST Result Date: 02/14/2024 CLINICAL DATA:  Code stroke.  68 year old male. EXAM: CT HEAD WITHOUT CONTRAST TECHNIQUE: Contiguous axial images were obtained from the base of the skull through the vertex without intravenous contrast. RADIATION DOSE REDUCTION: This exam was performed according to the departmental dose-optimization program which includes automated exposure control, adjustment of the mA and/or kV according to patient size and/or use of iterative reconstruction technique. COMPARISON:  Head CT 01/22/2021. FINDINGS: Brain: Cerebral volume is stable and normal for age. No midline shift, ventriculomegaly, mass effect, evidence of mass lesion, intracranial hemorrhage or evidence of cortically based acute infarction. Mild for age patchy periventricular white matter hypodensity appears stable, slightly greater in the left hemisphere. Vascular: Chronic generalized intracranial artery tortuosity. Calcified atherosclerosis at the skull base. No suspicious intracranial vascular hyperdensity. However, compared to 2022 the distal left vertebral artery appears atretic now, with bulky chronic upstream calcified atherosclerosis (series 6, image 33). Skull: Intact, negative. Sinuses/Orbits: Visualized paranasal sinuses and mastoids are stable and well aerated. Other: Postoperative changes to both globes now. No acute orbit or scalp soft tissue finding. ASPECTS [facility] Stroke Program Early CT Score) Total score (0-10 with 10 being normal): 10 IMPRESSION: 1. No acute cortically based infarct or acute intracranial hemorrhage identified. ASPECTS 10. Stable chronic white matter changes. 2. Compared to 2022 the distal Left Vertebral Artery now appears atretic, with bulky chronic calcified  atherosclerosis upstream. 3. These results were communicated to Dr. Bonnita Buttner at 9:11 am on 02/14/2024 by text page via the [facility] messaging system. Electronically Signed   By: Marlise Simpers M.D.   On: 02/14/2024 09:12    PROCEDURES:  Critical Care performed: No  Procedures   MEDICATIONS ORDERED IN ED: Medications  iohexol (OMNIPAQUE) 350 MG/ML injection 100 mL (100 mLs Intravenous Contrast Given 02/14/24 0902)     IMPRESSION / MDM / ASSESSMENT AND PLAN / ED COURSE  I reviewed the triage vital signs and the nursing notes.  Differential diagnosis includes, but is not limited to, acute stroke, intracranial bleed, complex migraine, TIA  Patient's presentation is most consistent with acute presentation with potential threat to life or bodily function.  68 year old male presenting with left-sided paresthesias found to have weakness with drift on exam as well as a partial left-sided field cut.  Outside thrombolytic window, but last known well is within 24 hours and does have some weakness with partial field cut.  In the setting of this, case was reviewed with Dr. Bonnita Buttner with neurology who did recommend code stroke activation.  Code stroke imaging did demonstrate a PCA infarct.  Dr. Bonnita Buttner reviewed the case with the neurointerventional team at [facility] and  discussed risk and benefits with patient. Decision was made not to proceed with procedural intervention.  He does recommend admission to the hospitalist service for further management in the setting of his acute stroke.  Discussed with patient who is agreeable.  Will reach out to hospitalist team.  10:14 AM Case discussed with Dr. Jeane Miguel.  He will evaluate for anticipated admission.     FINAL CLINICAL IMPRESSION(S) / ED DIAGNOSES   Final diagnoses:  Acute CVA (cerebrovascular accident) (HCC)  Left-sided weakness     Rx / DC Orders   ED Discharge Orders     None        Note:  This document was prepared using Dragon voice recognition software  and may include unintentional dictation errors.   Claria Crofts, MD 02/14/24 1014

## 2024-02-14 NOTE — Progress Notes (Signed)
 OT Cancellation Note  Patient Details Name: AUDON HEYMANN MRN: [id number] DOB: [birth year]   Cancelled Treatment:    Reason Eval/Treat Not Completed: OT screened, no needs identified, will sign off. Order received, chart reviewed. Per conversation with pt and observation of functional mobility during PT eval, pt back to baseline functional independence. No skilled OT needs identified. Will sign off. Please re-consult if additional needs arise.    Stevenson Elbe, Student OT  [employer] 02/14/2024, 3:49 PM

## 2024-02-14 NOTE — Consult Note (Addendum)
 NEUROLOGY CONSULT NOTE   Date of service: Feb 14, 2024 Patient Name: Brandon Galvan MRN:  [id number] DOB:  Feb 08, 1956 Chief Complaint: "Left-sided numbness" Requesting Provider: Claria Crofts, MD  History of Present Illness  Brandon Galvan is a 68 y.o. male with hx of hyperlipidemia, glaucoma, presented to the emergency department for evaluation of sudden onset of left-sided numbness and blurred vision. He reports that he was in his usual state of health yesterday but reports there might have been some headache going on the day prior as well.  To the best of her ability, last known well is 12 PM on 02/13/2024 when he started having some numbness on the left face.  This has then progressed to become worse and also has started having numbness in the left arm and leg. The ED provider called me and wanted my opinion on activation of a code stroke since he was within 24 hours and on the EDP evaluation had left superior quadrantanopsia. They also said that he had some subjective left-sided weakness. In the setting of left-sided motor, sensory and visual symptoms, I recommended a code stroke be activated and him be evaluated for an LVO. He denies any chest pain shortness of breath fevers chills.  Denies any similar episodes in the past  LKW: 12 PM 02/13/2024 Modified rankin score: 0-Completely asymptomatic and back to baseline post- stroke IV Thrombolysis: No-outside the window EVT: No-low NIH stroke scale, discussed with patient the possibility of thrombectomy-and after considerations of risks, benefits and alternatives, patient decided not to proceed  NIHSS components Score: Comment  1a Level of Conscious 0[x]  1[]  2[]  3[]      1b LOC Questions 0[x]  1[]  2[]       1c LOC Commands 0[x]  1[]  2[]       2 Best Gaze 0[x]  1[]  2[]       3 Visual 0[]  1[x]  2[]  3[]      4 Facial Palsy 0[x]  1[]  2[]  3[]      5a Motor Arm - left 0[x]  1[]  2[]  3[]  4[]  UN[]    5b Motor Arm - Right 0[x]  1[]  2[]  3[]  4[]  UN[]    6a Motor  Leg - Left 0[x]  1[]  2[]  3[]  4[]  UN[]    6b Motor Leg - Right 0[x]  1[]  2[]  3[]  4[]  UN[]    7 Limb Ataxia 0[x]  1[]  2[]  UN[]      8 Sensory 0[]  1[]  2[x]  UN[]      9 Best Language 0[x]  1[]  2[]  3[]      10 Dysarthria 0[x]  1[]  2[]  UN[]      11 Extinct. and Inattention 0[x]  1[]  2[]       TOTAL: 3      ROS  Comprehensive ROS performed and pertinent positives documented in HPI   Past History   Past Medical History:  Diagnosis Date   Arthritis    right hip   Depression    h/o   Family history of diabetes mellitus    History of kidney stones    still has a kidney stone as of 04-16-19    Hyperlipidemia    Kidney stone     Past Surgical History:  Procedure Laterality Date   CATARACT EXTRACTION W/PHACO Right 08/23/2021   Procedure: CATARACT EXTRACTION PHACO AND INTRAOCULAR LENS PLACEMENT (IOC) RIGHT;  Surgeon: Rosa College, MD;  Location: [facility];  Service: Ophthalmology;  Laterality: Right;  LEAVE ARRIVAL 8 3.83 00:32.1   CATARACT EXTRACTION W/PHACO Left 09/06/2021   Procedure: CATARACT EXTRACTION PHACO AND INTRAOCULAR LENS PLACEMENT (IOC) LEFT;  Surgeon: Rosa College,  MD;  Location: [facility];  Service: Ophthalmology;  Laterality: Left;  2.23 00:25.8   COLONOSCOPY WITH PROPOFOL  N/A 06/15/2017   Procedure: COLONOSCOPY WITH PROPOFOL ;  Surgeon: Luke Salaam, MD;  Location: [facility];  Service: Gastroenterology;  Laterality: N/A;   LITHOTRIPSY     TOTAL HIP ARTHROPLASTY Right 04/25/2019   Procedure: TOTAL HIP ARTHROPLASTY ANTERIOR APPROACH;  Surgeon: Molli Angelucci, MD;  Location: [facility];  Service: Orthopedics;  Laterality: Right;   TOTAL HIP ARTHROPLASTY Left 10/15/2021   Procedure: TOTAL HIP ARTHROPLASTY ANTERIOR APPROACH;  Surgeon: Molli Angelucci, MD;  Location: [facility];  Service: Orthopedics;  Laterality: Left;    Family History: Family History  Problem Relation Age of Onset   Osteoporosis Mother    Arthritis Mother    Dementia Mother    Hypertension  Father    Diabetes Father    Diabetes Sister    Hypertension Sister    Healthy Daughter    Healthy Son    Healthy Daughter    Healthy Daughter     Social History  reports that he quit smoking about 14 years ago. His smoking use included cigars. He has never used smokeless tobacco. He reports current alcohol use of about 13.0 standard drinks of alcohol per week. He reports that he does not use drugs.  Allergies  Allergen Reactions   Penicillin G Anaphylaxis, Hives and Swelling    Pt verbalizes swelling to lips and hives on abd Did it involve swelling of the face/tongue/throat, SOB, or low BP? Yes Did it involve sudden or severe rash/hives, skin peeling, or any reaction on the inside of your mouth or nose? yes Did you need to seek medical attention at a hospital or doctor's office? Yes When did it last happen?   2010    If all above answers are "NO", may proceed with cephalosporin use.    Amoxicillin Rash   Benadryl [Diphenhydramine] Rash    Medications  No current facility-administered medications for this encounter.  Current Outpatient Medications:    brimonidine -timolol  (COMBIGAN ) 0.2-0.5 % ophthalmic solution, Place 1 drop into both eyes every 12 (twelve) hours., Disp: , Rfl:    tadalafil  (CIALIS ) 20 MG tablet, Take 1 tablet (20 mg total) by mouth daily as needed for erectile dysfunction., Disp: 90 tablet, Rfl: 3  Vitals   Vitals:   2024/02/20 0826  BP: (!) 146/83  Pulse: 80  Resp: 20  Temp: 97.9 F (36.6 C)  TempSrc: Oral  SpO2: 97%    There is no height or weight on file to calculate BMI.  Physical Exam   General: Awake alert in no distress HEENT: Normocephalic atraumatic Lungs: Clear Cardiovascular: Regular rate rhythm Abdomen nondistended nontender Neurological exam Awake alert oriented x 3 No dysarthria No aphasia Cranial nerves: Pupils equal round react light, extraocular movements intact, visual field examination reveals left homonymous superior  quadrantanopsia, facial sensation diminished on the left face in comparison to the right, auditory acuity intact, tongue and palate midline. Motor examination reveals no drift in any of the 4 extremities but the left upper and extremity are 4+/5 in strength whereas the right upper and lower extremities are 5/5. Sensation diminished on the left knee but in comparison to the right to light touch without any evidence of extinction  Labs/Imaging/Neurodiagnostic studies   CBC:  Recent Labs  Lab Feb 20, 2024 0857  WBC 5.4  NEUTROABS 3.0  HGB 15.0  HCT 43.9  MCV 95.9  PLT 222   Basic Metabolic Panel:  Lab Results  Component Value Date   NA 139 02/14/2024   K 3.9 02/14/2024   CO2 24 02/14/2024   GLUCOSE 152 (H) 02/14/2024   BUN 16 02/14/2024   CREATININE 0.84 02/14/2024   CALCIUM 9.0 02/14/2024   GFRNONAA >60 02/14/2024   GFRAA >60 04/26/2019   Lipid Panel:  Lab Results  Component Value Date   LDLCALC 136 (H) 12/15/2016   Alcohol Level     Component Value Date/Time   [facility] <15 02/14/2024 0857   INR  Lab Results  Component Value Date   INR 1.0 02/14/2024   APTT  Lab Results  Component Value Date   APTT 27 02/14/2024   CT Head without contrast(Personally reviewed): Aspects 10.  No bleed  CT angio Head and Neck with contrast(Personally reviewed): Right P1/P2 occlusion with 20 to cc of penumbra, no core infarct.  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  Ectatic basilar and carotid arteries.  Mild carotid plaque.  ASSESSMENT   ARVIN ABELLO is a 68 y.o. male with past medical history as above who presents for evaluation of sudden onset of left-sided numbness and on clinical examination noted to have left superior quadrantanopsia. His examination is concerning for an LVO-sensory symptoms and visual symptoms indicate a posterior circulation stroke.  CT head was  unremarkable CT angiography shows a right P1/P2 junction occlusion with 20 cc of penumbra on the perfusion imaging with no core infarct. Outside the window for TNKase Case discussed with the interventionalist Dr. Laverta Potters over the phone.  He had a detailed discussion with me regarding offering intervention, and was amenable to perform a diagnostic angiogram and proceed to thrombectomy if amenable.  I discussed the risks, benefits and alternatives of thrombectomy with the patient and after careful consideration of those, he elected not to proceed given the bleeding complication associated with the procedure with his rather lower intensity of the stroke.  Impression: Acute ischemic stroke-involving the right posterior cerebral artery territory-likely atheroembolic  RECOMMENDATIONS  Admit to hospitalist Frequent neurochecks Telemetry MRI brain without contrast Load with aspirin 650 and Plavix 300. Continue aspirin 81 and Plavix 75 tomorrow onwards High intensity statin-atorvastatin 80-goal LDL less than 70. 2D echo Z3Y Lipid panel Blood pressure goal: Allow permissive hypertension and avoid hypotension.  Keep systolic blood pressure goal between 140-220.  Treat only if the blood pressure is outside of that goal with pressors if low and with antihypertensives as needed if high. Therapy assessments Will follow Plan discussed with patient, wife and Dr. Synetta Eves  ______________________________________________________________________    Signed, Tona Francis, MD [facility]

## 2024-02-14 NOTE — Progress Notes (Signed)
 [facility] Tel[facility]roke RN Code Stroke Note:  [phone]- Code stroke activation  [phone]- Dr Bonnita Buttner at bedside- NIH started  [phone]- NIH completed (see Dr Margarite Shearer note for full NIH)  [phone]- Pt taken for CT  LKW 02/13/2024 1200  MRS 0

## 2024-02-14 NOTE — Progress Notes (Signed)
  Chaplain On-Call responded to Code Stroke notification at 0851 hours.  The patient was at the CT Scan area for tests.  Chaplain met patient's wife Franky Ivanoff in ED room 15. Chaplain provided spiritual and emotional support for Franky Ivanoff and assured her of availability as needed.  Chaplain Dean Every., [facility]

## 2024-02-15 ENCOUNTER — Observation Stay

## 2024-02-15 DIAGNOSIS — I639 Cerebral infarction, unspecified: Secondary | ICD-10-CM | POA: Diagnosis not present

## 2024-02-15 DIAGNOSIS — R531 Weakness: Secondary | ICD-10-CM | POA: Diagnosis not present

## 2024-02-15 DIAGNOSIS — I63431 Cerebral infarction due to embolism of right posterior cerebral artery: Secondary | ICD-10-CM | POA: Diagnosis not present

## 2024-02-15 DIAGNOSIS — R29703 NIHSS score 3: Secondary | ICD-10-CM | POA: Diagnosis not present

## 2024-02-15 LAB — ECHOCARDIOGRAM COMPLETE
AR max vel: 1.45 cm2
AV Area VTI: 1.45 cm2
AV Area mean vel: 1.32 cm2
AV Mean grad: 6 mmHg
AV Peak grad: 10.1 mmHg
Ao pk vel: 1.59 m/s
Area-P 1/2: 3.74 cm2
Calc EF: 64.4 %
Height: 67 in
MV VTI: 1.49 cm2
S' Lateral: 2.9 cm
Single Plane A2C EF: 62.6 %
Single Plane A4C EF: 67.4 %
Weight: 3200 [oz_av]

## 2024-02-15 LAB — LIPID PANEL
Cholesterol: 205 mg/dL — ABNORMAL HIGH (ref 0–200)
HDL: 34 mg/dL — ABNORMAL LOW (ref >40)
LDL Cholesterol: 144 mg/dL — ABNORMAL HIGH (ref 0–99)
Total CHOL/HDL Ratio: 6 ratio
Triglycerides: 135 mg/dL (ref <150)
VLDL: 27 mg/dL (ref 0–40)

## 2024-02-15 MED ORDER — CLOPIDOGREL BISULFATE 75 MG PO TABS: 75.0000 mg | ORAL_TABLET | Freq: Every day | ORAL | 0 refills | Status: AC

## 2024-02-15 MED ORDER — ATORVASTATIN CALCIUM 80 MG PO TABS: 80.0000 mg | ORAL_TABLET | Freq: Every day | ORAL | 1 refills | Status: AC

## 2024-02-15 MED ORDER — ASPIRIN 81 MG PO TBEC: 81.0000 mg | DELAYED_RELEASE_TABLET | Freq: Every day | ORAL | 12 refills | Status: AC

## 2024-02-15 NOTE — Progress Notes (Addendum)
 NEUROLOGY CONSULT FOLLOW UP NOTE   Date of service: Feb 15, 2024 Patient Name: Brandon Galvan MRN:  [id number] DOB:  1956/04/03  Interval Hx/subjective  Seen and examined.  Some improvement in the numbness on the left side.  Vitals   Vitals:   02/14/24 1920 02/14/24 2318 02/15/24 0355 02/15/24 0755  BP: 132/87 138/82 119/75 (!) 147/99  Pulse: 65 (!) 59 (!) 58 60  Resp: 19 18 17 18   Temp: 98 F (36.7 C) 97.9 F (36.6 C) 98.2 F (36.8 C) 98 F (36.7 C)  TempSrc: Oral  Oral Oral  SpO2: 98% 99% 99% 99%  Weight:      Height:         Body mass index is 31.32 kg/m.  Physical Exam   General: Awake alert in no distress.  Eating breakfast sitting in chair. 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 Motor examination reveals no drift in any of the 4 extremities.  The strength also appears to be improved did in the left upper extremity lower extremity-all 4 are 5/5 in strength. Sensation diminished on the left side in comparison to the right but no evidence of extinction Coordination intact   Medications  Current Facility-Administered Medications:     stroke: early stages of recovery book, , Does not apply, Once, Antoniette Batty T, MD   acetaminophen  (TYLENOL ) tablet 650 mg, 650 mg, Oral, Q4H PRN **OR** acetaminophen  (TYLENOL ) 160 MG/5ML solution 650 mg, 650 mg, Per Tube, Q4H PRN **OR** acetaminophen  (TYLENOL ) suppository 650 mg, 650 mg, Rectal, Q4H PRN, [place], MD   aspirin EC tablet 81 mg, 81 mg,  Oral, Daily, Zhang, Ping T, MD   atorvastatin (LIPITOR) tablet 80 mg, 80 mg, Oral, Daily, Jeane Miguel, Ping T, MD, 80 mg at 02/14/24 1558   brimonidine  (ALPHAGAN ) 0.2 % ophthalmic solution 1 drop, 1 drop, Both Eyes, BID, Ramonita Burow, RPH, 1 drop at 02/14/24 8295   clopidogrel (PLAVIX) tablet 75 mg, 75 mg, Oral, Daily, Antoniette Batty T, MD   enoxaparin  (LOVENOX ) injection 40 mg, 40 mg, Subcutaneous, Q24H, Zhang, Ping T, MD, 40 mg at 02/14/24 2211   hydrALAZINE (APRESOLINE) injection 5 mg, 5 mg, Intravenous, Q6H PRN, Antoniette Batty T, MD   LORazepam (ATIVAN) tablet 0.5 mg, 0.5 mg, Oral, Q6H PRN, Antoniette Batty T, MD   senna-docusate (Senokot-S) tablet 1 tablet, 1 tablet, Oral, QHS PRN, Antoniette Batty T, MD   sodium chloride  flush (NS) 0.9 % injection 3-10 mL, 3-10 mL, Intravenous, Q12H, Jeane Miguel, Ping T, MD, 3 mL at 02/14/24 2211   sodium chloride  flush (NS) 0.9 % injection 3-10 mL, 3-10 mL, Intravenous, PRN, Antoniette Batty T, MD   timolol  (TIMOPTIC ) 0.5 % ophthalmic solution 1 drop, 1 drop, Both Eyes, BID, Ramonita Burow, RPH, 1 drop at 02/14/24 1602  Labs and Diagnostic Imaging   CBC:  Recent Labs  Lab 02/14/24 0857  WBC 5.4  NEUTROABS 3.0  HGB 15.0  HCT 43.9  MCV 95.9  PLT 222    Basic Metabolic Panel:  Lab Results  Component Value Date   NA 139 02/14/2024   K 3.9 02/14/2024   CO2 24  02/14/2024   GLUCOSE 152 (H) 02/14/2024   BUN 16 02/14/2024   CREATININE 0.84 02/14/2024   CALCIUM 9.0 02/14/2024   GFRNONAA >60 02/14/2024   GFRAA >60 04/26/2019   Lipid Panel:  Lab Results  Component Value Date   LDLCALC 144 (H) 02/15/2024   HgbA1c:  Lab Results  Component Value Date   HGBA1C 4.8 02/14/2024   Alcohol Level     Component Value Date/Time   [facility] <15 02/14/2024 0857   INR  Lab Results  Component Value Date   INR 1.0 02/14/2024   APTT  Lab Results  Component Value Date   APTT 27 02/14/2024   CT Head without contrast(Personally reviewed): Aspects 10.  No bleed   CT  angio Head and Neck with contrast(Personally reviewed): Right P1/P2 occlusion with 20 to cc of penumbra, no core infarct.  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  Ectatic basilar and carotid arteries.  Mild carotid plaque.  MRI brain: Patchy acute right PCA infarct including the temporal lobe, occipital lobe and thalamus.  Assessment   TAAJ HURLBUT is a 68 y.o. male presenting for evaluation of left-sided numbness and on examination noted to have left superior quadrantanopsia.  CT head unremarkable but CT angiogram of the head and neck showed a right P1/P2 junction occlusion which appeared acute.  There is no other atherosclerosis in the vessels making concern for this being a cardioembolic event. 2D echo unremarkable.  \ MRI confirms what was suspected-patchy right PCA infarct including right temporal, right occipital and right thalamus. May benefit from long-term cardiac monitoring-will involve cardiac EP for TEE and loop recorder.  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  Goal is less than 70.  High intensity statin atorvastatin 80 mg now and daily. A1c 4.8-at goal.  Goal less than 7. No clear source of stroke identified with the current workup. Lower extremity Dopplers Cardiac EP consult for TEE and loop recorder placement.  Discussed with the patient, and he is amenable. Continue DAPT for 3 weeks followed by aspirin only for now. If there is any evidence of atrial fibrillation or DVT or thrombus/may need anticoagulation. Further recommendations based on TEE and further workup. Plan was relayed to Dr. Ariel Begun. Cardiac EP APP has been contacted over secure chat.  Would appreciate cardiology EP  evaluation  ______________________________________________________________________   Leala Prince, MD [facility]   Addendum TEE not able to be performed till Monday.  That is at least 4 days-do not think it is justifiable to keep him in the hospital for 4 days just wait for TEE. Reach out to the cardiology team.  They can schedule him for TEE next week and also for loop recorder placement if TEE is negative-both cardiology (general), and cardiology electrophysiology teams are aware and will coordinate with the help of the primary hospitalist. Rest of the recommendations above Follow-up with outpatient neurology in the next 2 to 4 weeks and not the usual 8 to 12 weeks since treatment options might change based on the findings etc. I will also reach out to the [facility] clinic neurologist, where he prefers to follow, to ensure that he is on the radar.  -- Tona Francis, MD Neurologist [facility]

## 2024-02-15 NOTE — Hospital Course (Addendum)
 Taken from H&P.  Brandon Galvan is a 68 y.o. male with medical history significant of glaucoma, presented with strokelike symptoms including new onset of left-sided numbness and left hand weakness and clumsy.  Symptoms started after prior to the presentation and slowly progressed.  On presentation patient had borderline bradycardia, mildly elevated blood pressure with systolic in 160s.  Labs stable.  Initial CT head was negative for acute abnormalities.  CTA shows occlusion of right PCA P1/P2 junction.  Patient was given loading dose of aspirin and Plavix and admitted for stroke workup.  Neurology was consulted.  Patient was offered thrombectomy of right PCA, patient elected not to proceed with IR intervention due to possible complication of bleeding.  5/29: Vital stable.  MRI with patchy acute right PCA infarcts.  Mild chronic small vessel ischemic disease.  Lipid panel with elevated total cholesterol at 205 HDL 34 and LDL of 144 with goal of less than 70.  A1c of 4.8. Symptoms improving, PT and OT with no follow-up recommendations. Echocardiogram was normal.  No intra-atrial shunt was detected  Neurology evaluated him and there is a concern of cardioembolic stroke.  They are recommending TEE and loop recorder placement.  EP was consulted and they should be able to do these procedures as outpatient next week.  Patient was started on DAPT with aspirin and Plavix for 21 days followed by aspirin only.  He was also started on high intensity statin.  Patient has mildly elevated blood pressure.  He was not on any antihypertensives at home.  Need a close follow-up with PCP and they can start him on antihypertensives if needed.  Outpatient referral to see neurology was provided for stroke workup.  If there is a concern of A-fib or found to have embolic stroke then they might switch him to anticoagulation.  Patient will continue on current medications and need to have a close follow-up with his  providers for further assistance.

## 2024-02-15 NOTE — Plan of Care (Signed)
  Problem: Clinical Measurements: Goal: Ability to maintain clinical measurements within normal limits will improve Outcome: Progressing   Problem: Activity: Goal: Risk for activity intolerance will decrease Outcome: Progressing   Problem: Elimination: Goal: Will not experience complications related to urinary retention Outcome: Progressing   Problem: Education: Goal: Knowledge of disease or condition will improve Outcome: Progressing

## 2024-02-15 NOTE — Care Management Obs Status (Signed)
 MEDICARE OBSERVATION STATUS NOTIFICATION   Patient Details  Name: Brandon Galvan MRN: [id number] Date of Birth: 08-18-1956   Medicare Observation Status Notification Given:  Yes    Ivet Guerrieri W, CMA 02/15/2024, 10:58 AM

## 2024-02-15 NOTE — TOC Transition Note (Signed)
 Transition of Care [facility]) - Discharge Note   Patient Details  Name: Brandon Galvan MRN: [id number] Date of Birth: December 15, 1955  [facility] of [facility]) CM/SW Contact:  Crayton Docker, RN 02/15/2024, 1:07 PM   Clinical Narrative:     Discharge orders for home/self care. Patient's wife, Franky Ivanoff at patient's side and will provide transportation and caregiver support.    Final next level of care: Home/Self Care Barriers to Discharge: No Barriers Identified   Patient Goals and CMS Choice    Home/self care   Discharge Placement               Home/self care   Discharge Plan and Services Additional resources added to the After Visit Summary for        Social Drivers of Health (SDOH) Interventions SDOH Screenings   Food Insecurity: No Food Insecurity (02/14/2024)  Housing: Low Risk  (02/14/2024)  Transportation Needs: No Transportation Needs (02/14/2024)  Utilities: Not At Risk (02/14/2024)  Financial Resource Strain: Low Risk  (06/23/2023)   Received from [facility]  Social Connections: Moderately Integrated (02/14/2024)  Tobacco Use: Medium Risk (02/14/2024)     Readmission Risk Interventions     No data to display

## 2024-02-15 NOTE — Discharge Summary (Signed)
 Physician Discharge Summary   Patient: Brandon Galvan MRN: [id number] DOB: 22-Dec-1955  Admit date:     02/14/2024  Discharge date: 02/15/24  Discharge Physician: Luna Salinas   PCP: Sari Cunning, MD   Recommendations at discharge:  Please obtain CBC and BMP on follow-up Follow-up with primary care provider Please start antihypertensives if appropriate as blood pressure was mildly elevated Follow-up with neurology Follow-up with cardiology for TEE and loop recorder placement  Discharge Diagnoses: Principal Problem:   Stroke [facility]) Active Problems:   Stroke (cerebrum) (HCC)   Left-sided weakness   Hospital Course: Taken from H&P.  Brandon Galvan is a 68 y.o. male with medical history significant of glaucoma, presented with strokelike symptoms including new onset of left-sided numbness and left hand weakness and clumsy.  Symptoms started after prior to the presentation and slowly progressed.  On presentation patient had borderline bradycardia, mildly elevated blood pressure with systolic in 160s.  Labs stable.  Initial CT head was negative for acute abnormalities.  CTA shows occlusion of right PCA P1/P2 junction.  Patient was given loading dose of aspirin and Plavix and admitted for stroke workup.  Neurology was consulted.  Patient was offered thrombectomy of right PCA, patient elected not to proceed with IR intervention due to possible complication of bleeding.  5/29: Vital stable.  MRI with patchy acute right PCA infarcts.  Mild chronic small vessel ischemic disease.  Lipid panel with elevated total cholesterol at 205 HDL 34 and LDL of 144 with goal of less than 70.  A1c of 4.8. Symptoms improving, PT and OT with no follow-up recommendations. Echocardiogram was normal.  No intra-atrial shunt was detected  Neurology evaluated him and there is a concern of cardioembolic stroke.  They are recommending TEE and loop recorder placement.  EP was consulted and they should be able  to do these procedures as outpatient next week.  Patient was started on DAPT with aspirin and Plavix for 21 days followed by aspirin only.  He was also started on high intensity statin.  Patient has mildly elevated blood pressure.  He was not on any antihypertensives at home.  Need a close follow-up with PCP and they can start him on antihypertensives if needed.  Outpatient referral to see neurology was provided for stroke workup.  If there is a concern of A-fib or found to have embolic stroke then they might switch him to anticoagulation.  Patient will continue on current medications and need to have a close follow-up with his providers for further assistance.  Consultants: Neurology Procedures performed: None Disposition: Home Diet recommendation:  Discharge Diet Orders (From admission, onward)     Start     Ordered   02/15/24 0000  Diet - low sodium heart healthy        02/15/24 1155           Cardiac diet DISCHARGE MEDICATION: Allergies as of 02/15/2024       Reactions   Penicillin G Anaphylaxis, Hives, Swelling   Pt verbalizes swelling to lips and hives on abd Did it involve swelling of the face/tongue/throat, SOB, or low BP? Yes Did it involve sudden or severe rash/hives, skin peeling, or any reaction on the inside of your mouth or nose? yes Did you need to seek medical attention at a hospital or doctor's office? Yes When did it last happen?   2010    If all above answers are "NO", may proceed with cephalosporin use.   Amoxicillin Rash  Benadryl [diphenhydramine] Rash        Medication List     TAKE these medications    aspirin  EC 81 MG tablet Take 1 tablet (81 mg total) by mouth daily. Swallow whole. Start taking on: Feb 16, 2024   atorvastatin  80 MG tablet Commonly known as: LIPITOR Take 1 tablet (80 mg total) by mouth daily. Start taking on: Feb 16, 2024   brimonidine -timolol  0.2-0.5 % ophthalmic solution Commonly known as: COMBIGAN  Place 1 drop  into both eyes every 12 (twelve) hours.   clopidogrel  75 MG tablet Commonly known as: PLAVIX  Take 1 tablet (75 mg total) by mouth daily for 21 days. Start taking on: Feb 16, 2024   tadalafil  20 MG tablet Commonly known as: CIALIS  Take 1 tablet (20 mg total) by mouth daily as needed for erectile dysfunction.        Follow-up Information     Sari Cunning, MD Follow up.   Specialty: Internal Medicine Why: Hospital follow up Contact information: [address] [facility] [place] [address] [phone]         Rosan Comfort, MD. Schedule an appointment as soon as possible for a visit.   Specialty: Neurology Contact information: [phone] [facility] [address] [facility] [place] [address] [phone]                Discharge Exam: Brandon Galvan Weights   02/14/24 1122  Weight: 90.7 kg   General.  Well-developed gentleman, in no acute distress. Pulmonary.  Lungs clear bilaterally, normal respiratory effort. CV.  Regular rate and rhythm, no JVD, rub or murmur. Abdomen.  Soft, nontender, nondistended, BS positive. CNS.  Alert and oriented .  No focal neurologic deficit. Extremities.  No edema, no cyanosis, pulses intact and symmetrical. Psychiatry.  Judgment and insight appears normal.   Condition at discharge: stable  The results of significant diagnostics from this hospitalization (including imaging, microbiology, ancillary and laboratory) are listed below for reference.   Imaging Studies: ECHOCARDIOGRAM COMPLETE Result Date: 02/15/2024    ECHOCARDIOGRAM REPORT   Patient Name:   Brandon Galvan Date of Exam: 02/14/2024 Medical Rec #:  [id number]         Height:       67.0 in Accession #:    [id number]        Weight:       200.0 lb Date of Birth:  1956/05/27         BSA:          2.022 m Patient Age:    67 years          BP:           157/88 mmHg Patient Gender: M                 HR:           64 bpm. Exam Location:   [facility] Procedure: 2D Echo, Cardiac Doppler and Color Doppler (Both Spectral and Color            Flow Doppler were utilized during procedure). Indications:     Stroke  History:         Patient has no prior history of Echocardiogram examinations.                  Risk Factors:Dyslipidemia.  Sonographer:     Aldon Ambrosia Referring Phys:  [id number] [place] Diagnosing Phys: Antionette Kirks MD IMPRESSIONS  1. Left ventricular  ejection fraction, by estimation, is 60 to 65%. The left ventricle has normal function. The left ventricle has no regional wall motion abnormalities. Left ventricular diastolic parameters were normal.  2. Right ventricular systolic function is normal. The right ventricular size is normal.  3. The mitral valve is normal in structure. No evidence of mitral valve regurgitation. No evidence of mitral stenosis.  4. The aortic valve is normal in structure. Aortic valve regurgitation is not visualized. Aortic valve sclerosis/calcification is present, without any evidence of aortic stenosis.  5. The inferior vena cava is normal in size with greater than 50% respiratory variability, suggesting right atrial pressure of 3 mmHg. FINDINGS  Left Ventricle: Left ventricular ejection fraction, by estimation, is 60 to 65%. The left ventricle has normal function. The left ventricle has no regional wall motion abnormalities. The left ventricular internal cavity size was normal in size. There is  no left ventricular hypertrophy. Left ventricular diastolic parameters were normal. Right Ventricle: The right ventricular size is normal. No increase in right ventricular wall thickness. Right ventricular systolic function is normal. Left Atrium: Left atrial size was normal in size. Right Atrium: Right atrial size was normal in size. Pericardium: There is no evidence of pericardial effusion. Mitral Valve: The mitral valve is normal in structure. No evidence of mitral valve regurgitation. No evidence of mitral valve stenosis.  MV peak gradient, 2.4 mmHg. The mean mitral valve gradient is 1.0 mmHg. Tricuspid Valve: The tricuspid valve is normal in structure. Tricuspid valve regurgitation is not demonstrated. No evidence of tricuspid stenosis. Aortic Valve: The aortic valve is normal in structure. Aortic valve regurgitation is not visualized. Aortic valve sclerosis/calcification is present, without any evidence of aortic stenosis. Aortic valve mean gradient measures 6.0 mmHg. Aortic valve peak  gradient measures 10.1 mmHg. Aortic valve area, by VTI measures 1.45 cm. Pulmonic Valve: The pulmonic valve was normal in structure. Pulmonic valve regurgitation is not visualized. No evidence of pulmonic stenosis. Aorta: The aortic root is normal in size and structure. Venous: The inferior vena cava is normal in size with greater than 50% respiratory variability, suggesting right atrial pressure of 3 mmHg. IAS/Shunts: No atrial level shunt detected by color flow Doppler.  LEFT VENTRICLE PLAX 2D LVIDd:         4.90 cm      Diastology LVIDs:         2.90 cm      LV e' medial:    7.72 cm/s LV PW:         0.70 cm      LV E/e' medial:  9.1 LV IVS:        0.80 cm      LV e' lateral:   5.87 cm/s LVOT diam:     1.90 cm      LV E/e' lateral: 12.0 LV SV:         48 LV SV Index:   24 LVOT Area:     2.84 cm  LV Volumes (MOD) LV vol d, MOD A2C: 134.0 ml LV vol d, MOD A4C: 168.0 ml LV vol s, MOD A2C: 50.1 ml LV vol s, MOD A4C: 54.7 ml LV SV MOD A2C:     83.9 ml LV SV MOD A4C:     168.0 ml LV SV MOD BP:      101.2 ml RIGHT VENTRICLE RV Basal diam:  3.20 cm RV Mid diam:    2.50 cm RV S prime:     12.40 cm/s TAPSE (M-mode):  2.1 cm LEFT ATRIUM             Index        RIGHT ATRIUM          Index LA diam:        2.60 cm 1.29 cm/m   RA Area:     9.23 cm LA Vol (A2C):   39.4 ml 19.48 ml/m  RA Volume:   18.10 ml 8.95 ml/m LA Vol (A4C):   25.4 ml 12.56 ml/m LA Biplane Vol: 32.0 ml 15.82 ml/m  AORTIC VALVE                     PULMONIC VALVE AV Area (Vmax):    1.45  cm      PV Vmax:       1.09 m/s AV Area (Vmean):   1.32 cm      PV Peak grad:  4.7 mmHg AV Area (VTI):     1.45 cm AV Vmax:           159.00 cm/s AV Vmean:          120.000 cm/s AV VTI:            0.332 m AV Peak Grad:      10.1 mmHg AV Mean Grad:      6.0 mmHg LVOT Vmax:         81.40 cm/s LVOT Vmean:        55.800 cm/s LVOT VTI:          0.170 m LVOT/AV VTI ratio: 0.51  AORTA Ao Root diam: 3.00 cm Ao Asc diam:  3.75 cm MITRAL VALVE MV Area (PHT): 3.74 cm    SHUNTS MV Area VTI:   1.49 cm    Systemic VTI:  0.17 m MV Peak grad:  2.4 mmHg    Systemic Diam: 1.90 cm MV Mean grad:  1.0 mmHg MV Vmax:       0.77 m/s MV Vmean:      48.3 cm/s MV Decel Time: 203 msec MV E velocity: 70.20 cm/s MV A velocity: 56.10 cm/s MV E/A ratio:  1.25 Antionette Kirks MD Electronically signed by Antionette Kirks MD Signature Date/Time: 02/15/2024/8:30:09 AM    Final    MR BRAIN WO CONTRAST Result Date: 02/14/2024 CLINICAL DATA:  Neuro deficit, acute, stroke suspected. Left-sided paresthesias. EXAM: MRI HEAD WITHOUT CONTRAST TECHNIQUE: Multiplanar, multiecho pulse sequences of the brain and surrounding structures were obtained without intravenous contrast. COMPARISON:  Head CT and CTA 02/14/2024 FINDINGS: Brain: Patchy acute cortical and subcortical infarcts are present in the mesial right temporal and occipital lobes, and small acute infarcts also involve the right thalamus and posterior limb of the internal capsule. There is associated mild cytotoxic edema without mass effect. No intracranial hemorrhage, midline shift, or extra-axial fluid collection is identified. Scattered small T2 hyperintensities elsewhere in the cerebral white matter bilaterally are nonspecific but compatible with mild chronic small vessel ischemic disease. Cerebral volume is within normal limits for age. The ventricles are normal in size. Vascular: Known right PCA occlusion with associated susceptibility. Abnormal appearance of the distal left vertebral artery  also corresponding to known occlusion. Skull and upper cervical spine: Unremarkable bone marrow signal. Sinuses/Orbits: Bilateral cataract extraction. Paranasal sinuses and mastoid air cells are clear. Other: None. IMPRESSION: 1. Patchy acute right PCA infarcts. 2. Mild chronic small vessel ischemic disease. Electronically Signed   By: Aundra Lee M.D.   On: 02/14/2024 15:25   CT ANGIO  HEAD NECK W WO CM W PERF (CODE STROKE) Result Date: 02/14/2024 CLINICAL DATA:  68 year old male code stroke presentation. EXAM: CT ANGIOGRAPHY HEAD AND NECK CT PERFUSION BRAIN TECHNIQUE: Multidetector CT imaging of the head and neck was performed using the standard protocol during bolus administration of intravenous contrast. Multiplanar CT image reconstructions and MIPs were obtained to evaluate the vascular anatomy. Carotid stenosis measurements (when applicable) are obtained utilizing NASCET criteria, using the distal internal carotid diameter as the denominator. Multiphase CT imaging of the brain was performed following IV bolus contrast injection. Subsequent parametric perfusion maps were calculated using RAPID software. RADIATION DOSE REDUCTION: This exam was performed according to the departmental dose-optimization program which includes automated exposure control, adjustment of the mA and/or kV according to patient size and/or use of iterative reconstruction technique. CONTRAST:  OMNIPAQUE IOHEXOL 350 MG/ML SOLN COMPARISON:  Plain head CT 0902 hours today. FINDINGS: CT Brain Perfusion Findings: ASPECTS: 10 CBF (<30%) Volume: 0mL Perfusion (Tmax>6.0s) volume: 22mL.  Hypoperfusion index of zero. Mismatch Volume: 22mL Infarction Location:Right PCA CTA NECK Skeleton: Cervical spine degeneration. Hyperostosis appearing upper thoracic interbody ankylosis. No acute osseous abnormality identified. Upper chest: Negative aside from some mediastinal lipomatosis. Other neck: Nonvascular neck soft tissue spaces are within normal  limits. Aortic arch: Aberrant origin of the right subclavian artery. Four vessel arch configuration. Mild arch atherosclerosis. Right carotid system: Patent with mild to moderate soft and calcified plaque at the posterior right ICA origin and bulb. No stenosis. Left carotid system: Patent with mild tortuosity. Minimal plaque at the left carotid bifurcation and no stenosis. Vertebral arteries: Aberrant origin right subclavian artery with mild soft and calcified plaque, no stenosis. Patent right vertebral artery origin with no significant plaque or stenosis. Dominant appearance of the right vertebral artery to the skull base with no stenosis. Proximal left subclavian artery is mildly tortuous with mild plaque. Left vertebral artery origin appears both diminutive but also stenotic due to soft plaque on series 9, image 186. The left vertebral artery is patent but small to the skull base. CTA HEAD Posterior circulation: Left vertebral V4 segment appears occluded at the crossing of the dura, with bulky downstream calcified plaque. No reconstitution. Distal right vertebral artery is patent and supplies the basilar with mild irregularity and tortuosity at the vertebrobasilar junction, but no significant stenosis. Right PICA origin is patent. Tortuous basilar artery is patent, and the basilar tip is ectatic. SCA and PCA origins are patent without stenosis on series 12, image 22. Small left posterior communicating artery is visible, the right is diminutive or absent. Left PCA branches are within normal limits. Right PCA is occluded in the P1/P2 junction seen on series 11, image 21. Little if any distal reconstitution. Anterior circulation: Both ICA siphons are patent. Asymmetric left cavernous sinus enhancement is probably physiologic. Ectatic appearance of the left ICA siphon with no significant plaque or stenosis. Ectatic right ICA siphon with no significant plaque or stenosis. Left posterior communicating artery origin  appears normal. Patent carotid termini, normal MCA and ACA origins. Tortuous A1 segments. Diminutive or absent anterior communicating artery. Bilateral ACA branches are within normal limits. Left MCA M1 segment tortuous bifurcation without stenosis. Right MCA M1 segment and tortuous trifurcation without stenosis. Bilateral MCA branches are within normal limits. Venous sinuses: Patent. Arachnoid granulation at the junction of the right transverse and sigmoid sinuses (physiologic). Anatomic variants: Affectively dominant right vertebral artery which supplies the basilar. Aberrant origin of the right subclavian artery. Review of the MIP images  confirms the above findings Preliminary report of the CTA findings communicated to Dr. Arora at 9:18 am on 02/14/2024 by text page via the [facility] messaging system. IMPRESSION: 1. Positive for Right PCA Occlusion at the P1/P2 junction with associated 22 mL oligemia on CT Perfusion. No infarct core detected. 2. 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 3. Ectatic basilar and carotid arteries.  Mild carotid plaque. Electronically Signed   By: Marlise Simpers M.D.   On: 02/14/2024 09:27   CT HEAD CODE STROKE WO CONTRAST Result Date: 02/14/2024 CLINICAL DATA:  Code stroke.  68 year old male. EXAM: CT HEAD WITHOUT CONTRAST TECHNIQUE: Contiguous axial images were obtained from the base of the skull through the vertex without intravenous contrast. RADIATION DOSE REDUCTION: This exam was performed according to the departmental dose-optimization program which includes automated exposure control, adjustment of the mA and/or kV according to patient size and/or use of iterative reconstruction technique. COMPARISON:  Head CT 01/22/2021. FINDINGS: Brain: Cerebral volume is stable and normal for age. No midline shift, ventriculomegaly, mass effect, evidence of mass lesion, intracranial  hemorrhage or evidence of cortically based acute infarction. Mild for age patchy periventricular white matter hypodensity appears stable, slightly greater in the left hemisphere. Vascular: Chronic generalized intracranial artery tortuosity. Calcified atherosclerosis at the skull base. No suspicious intracranial vascular hyperdensity. However, compared to 2022 the distal left vertebral artery appears atretic now, with bulky chronic upstream calcified atherosclerosis (series 6, image 33). Skull: Intact, negative. Sinuses/Orbits: Visualized paranasal sinuses and mastoids are stable and well aerated. Other: Postoperative changes to both globes now. No acute orbit or scalp soft tissue finding. ASPECTS [facility] Stroke Program Early CT Score) Total score (0-10 with 10 being normal): 10 IMPRESSION: 1. No acute cortically based infarct or acute intracranial hemorrhage identified. ASPECTS 10. Stable chronic white matter changes. 2. Compared to 2022 the distal Left Vertebral Artery now appears atretic, with bulky chronic calcified atherosclerosis upstream. 3. These results were communicated to Dr. Bonnita Buttner at 9:11 am on 02/14/2024 by text page via the [facility] messaging system. Electronically Signed   By: Marlise Simpers M.D.   On: 02/14/2024 09:12    Microbiology: Results for orders placed or performed during the hospital encounter of 10/13/21  SARS CORONAVIRUS 2 (TAT 6-24 HRS) Nasopharyngeal Nasopharyngeal Swab     Status: None   Collection Time: 10/13/21  8:13 AM   Specimen: Nasopharyngeal Swab  Result Value Ref Range Status   SARS Coronavirus 2 NEGATIVE NEGATIVE Final    Comment: (NOTE) SARS-CoV-2 target nucleic acids are NOT DETECTED.  The SARS-CoV-2 RNA is generally detectable in upper and lower respiratory specimens during the acute phase of infection. Negative results do not preclude SARS-CoV-2 infection, do not rule out co-infections with other pathogens, and should not be used as the sole basis for treatment or  other patient management decisions. Negative results must be combined with clinical observations, patient history, and epidemiological information. The expected result is Negative.  Fact Sheet for Patients: HairSlick.no  Fact Sheet for Healthcare Providers: quierodirigir.com  This test is not yet approved or cleared by the [place]  FDA and  has been authorized for detection and/or diagnosis of SARS-CoV-2 by FDA under an Emergency Use Authorization (EUA). This EUA will remain  in effect (meaning this test can be used) for the duration of the COVID-19 declaration under Se ction 564(b)(1) of the Act, 21 U.S.C. section 360bbb-3(b)(1),  unless the authorization is terminated or revoked sooner.  Performed at [facility], [address], [place], [place] [address]     Labs: CBC: Recent Labs  Lab 02/14/24 0857  WBC 5.4  NEUTROABS 3.0  HGB 15.0  HCT 43.9  MCV 95.9  PLT 222   Basic Metabolic Panel: Recent Labs  Lab 02/14/24 0857  NA 139  K 3.9  CL 106  CO2 24  GLUCOSE 152*  BUN 16  CREATININE 0.84  CALCIUM  9.0   Liver Function Tests: Recent Labs  Lab 02/14/24 0857  AST 20  ALT 22  ALKPHOS 50  BILITOT 0.6  PROT 6.4*  ALBUMIN 3.7   CBG: Recent Labs  Lab 02/14/24 0856  GLUCAP 154*    Discharge time spent: greater than 30 minutes.  This record has been created using Conservation officer, historic buildings. Errors have been sought and corrected,but may not always be located. Such creation errors do not reflect on the standard of care.   Signed: Luna Salinas, MD [facility] 02/15/2024

## 2024-02-16 ENCOUNTER — Telehealth: Payer: Self-pay

## 2024-02-16 NOTE — Telephone Encounter (Signed)
 Per Cadence, patient requires a TEE for stroke evaluation. Spoke with the patient and informed him of the plan. He is scheduled for a TEE on 03/01/24 at 7:30 AM and understands to arrive at 6:30 AM. The following instructions were provided to the patient.      Dear Brandon Galvan  You are scheduled for a TEE (Transesophageal Echocardiogram) on Friday, June 13 with Dr. Junnie Olives.  Please arrive at the [facility] Entrance of [facility], [address], [place] [address] at 6:30 AM (This is 1 hour(s) prior to your procedure time).  Proceed to the Check-In Desk directly inside the entrance.  Procedure Parking: Use the entrance off of the [address] side of the hospital. Turn right upon entering and follow the driveway to parking that is directly in front of the [facility]. There is no valet parking available at this entrance, however there is an awning directly in front of the [facility] for drop off/ pick up for patients.    DIET:  Nothing to eat or drink after midnight except a sip of water with medications (see medication instructions below)  LABS: Completed on 02/14/24  FYI:  For your safety, and to allow us  to monitor your vital signs accurately during the surgery/procedure we request: If you have artificial nails, gel coating, SNS etc, please have those removed prior to your surgery/procedure. Not having the nail coverings /polish removed may result in cancellation or delay of your surgery/procedure.  Your support person will be asked to wait in the waiting room during your procedure.  It is OK to have someone drop you off and come back when you are ready to be discharged.  You cannot drive after the procedure and will need someone to drive you home.  Bring your insurance cards.  *Special Note: Every effort is made to have your procedure done on time. Occasionally there are emergencies that occur at the hospital that may cause delays. Please be  patient if a delay does occur.

## 2024-02-20 ENCOUNTER — Telehealth: Payer: Self-pay | Admitting: *Deleted

## 2024-02-20 NOTE — Telephone Encounter (Signed)
 Left voicemail message for patient to call back.    Patient has not been seen here in our office and is scheduled for upcoming TEE to be done by Dr. Junnie Olives and was ordered by Cadence Furth PA-C. He needs formal appointment in order to meet pre procedure guidelines. Only one opening before scheduled procedure and that was on 02/22/24 at 10:55 am. Scheduled and will discuss with patient when he returns call back.

## 2024-02-20 NOTE — Telephone Encounter (Signed)
 Left voicemail message to call back

## 2024-02-22 ENCOUNTER — Encounter: Payer: Self-pay | Admitting: Medical

## 2024-02-22 ENCOUNTER — Ambulatory Visit: Attending: Medical | Admitting: Medical

## 2024-02-22 VITALS — BP 140/98 | HR 55 | Ht 67.0 in | Wt 198.1 lb

## 2024-02-22 DIAGNOSIS — I639 Cerebral infarction, unspecified: Secondary | ICD-10-CM

## 2024-02-22 DIAGNOSIS — R531 Weakness: Secondary | ICD-10-CM

## 2024-02-22 NOTE — Progress Notes (Signed)
 Cardiology Office Note   Date:  02/22/2024  ID:  Brandon, Galvan 07/19/1956, MRN [id number] PCP: Sari Cunning, MD  [facility] Providers Cardiologist:  None     History of Present Illness Brandon Galvan is a 68 y.o. male with a history of GERD, recent stroke who presents for hospital follow-up.  The patient was recently admitted for stroke.  He presented with left-sided numbness and left hand weakness.  Initial CT head was negative.  CTA showed occlusion of right PCA P1/P2 junction. MRI head showed right infarct including right temporal, right occipital and right thalamus. Patient was discharged with aspirin  and Plavix .  He was eval for thrombectomy of right PCA for life did not proceed with IR admission.  Echocardiogram showed poor function.  Symptoms.  Plan for TEE and ILR implantation as outpatient. He was discharged on ASA and Plavix .   Today, the patient is overall doing well. He is back at work and doing well. He denies anginal symptoms. He is agreeable to TEE.   Studies Reviewed EKG Interpretation Date/Time:  Thursday February 22 2024 11:11:44 EDT Ventricular Rate:  55 PR Interval:  148 QRS Duration:  106 QT Interval:  422 QTC Calculation: 403 R Axis:   11  Text Interpretation: Sinus bradycardia Incomplete right bundle branch block When compared with ECG of 14-Feb-2024 08:34, PREVIOUS ECG IS PRESENT Confirmed by Gennaro Khat, [place] ([id number]) on 02/22/2024 11:16:02 AM    Echo 01/2024 1. Left ventricular ejection fraction, by estimation, is 60 to 65%. The  left ventricle has normal function. The left ventricle has no regional  wall motion abnormalities. Left ventricular diastolic parameters were  normal.   2. Right ventricular systolic function is normal. The right ventricular  size is normal.   3. The mitral valve is normal in structure. No evidence of mitral valve  regurgitation. No evidence of mitral stenosis.   4. The aortic valve is normal in structure. Aortic  valve regurgitation is  not visualized. Aortic valve sclerosis/calcification is present, without  any evidence of aortic stenosis.   5. The inferior vena cava is normal in size with greater than 50%  respiratory variability, suggesting right atrial pressure of 3 mmHg.   Physical Exam VS:  BP (!) 140/98 (BP Location: Left Arm, Patient Position: Sitting, Cuff Size: Normal)   Pulse (!) 55   Ht 5\' 7"  (1.702 m)   Wt 198 lb 2 oz (89.9 kg)   SpO2 98%   BMI 31.03 kg/m    Wt Readings from Last 3 Encounters:  02/22/24 198 lb 2 oz (89.9 kg)  02/14/24 200 lb (90.7 kg)  10/03/22 187 lb (84.8 kg)    GEN: Well nourished, well developed in no acute distress NECK: No JVD; No carotid bruits CARDIAC: RRR, no murmurs, rubs, gallops RESPIRATORY:  Clear to auscultation without rales, wheezing or rhonchi  ABDOMEN: Soft, non-tender, non-distended EXTREMITIES:  No edema; No deformity   ASSESSMENT AND PLAN  Stroke Patient was recently admitted for a stroke of right PCA treated conservatively. 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 Echo showed normal LVEF. Plan was for TEE in the hospital, but we were unable to do this. He is here for TEE consent. Plan for ILR implantation and has apt to see EP later this month. The patient denies chest pain, SOB, LLE. He is back to normal function. Continue ASA+ Plavix  x 21 days, followed by ASA only. Continue Lipitor.      Informed Consent   Shared  Decision Making/Informed Consent   The risks [esophageal damage, perforation (1:10,000 risk), bleeding, pharyngeal hematoma as well as other potential complications associated with conscious sedation including aspiration, arrhythmia, respiratory failure and death], benefits (treatment guidance and diagnostic support) and alternatives of a transesophageal echocardiogram were discussed in detail with Mr. Siska and he is willing to proceed.      Dispo: PRN  Signed, Yoko Mcgahee Rebekah Canada, PA-C

## 2024-02-22 NOTE — H&P (View-Only) (Signed)
 Cardiology Office Note   Date:  02/22/2024  ID:  Brandon Galvan, Brandon Galvan 07/19/1956, MRN [id number] PCP: Sari Cunning, MD  [facility] Providers Cardiologist:  None     History of Present Illness Brandon Galvan is a 68 y.o. male with a history of GERD, recent stroke who presents for hospital follow-up.  The patient was recently admitted for stroke.  He presented with left-sided numbness and left hand weakness.  Initial CT head was negative.  CTA showed occlusion of right PCA P1/P2 junction. MRI head showed right infarct including right temporal, right occipital and right thalamus. Patient was discharged with aspirin  and Plavix .  He was eval for thrombectomy of right PCA for life did not proceed with IR admission.  Echocardiogram showed poor function.  Symptoms.  Plan for TEE and ILR implantation as outpatient. He was discharged on ASA and Plavix .   Today, the patient is overall doing well. He is back at work and doing well. He denies anginal symptoms. He is agreeable to TEE.   Studies Reviewed EKG Interpretation Date/Time:  Thursday February 22 2024 11:11:44 EDT Ventricular Rate:  55 PR Interval:  148 QRS Duration:  106 QT Interval:  422 QTC Calculation: 403 R Axis:   11  Text Interpretation: Sinus bradycardia Incomplete right bundle branch block When compared with ECG of 14-Feb-2024 08:34, PREVIOUS ECG IS PRESENT Confirmed by Gennaro Khat, [place] ([id number]) on 02/22/2024 11:16:02 AM    Echo 01/2024 1. Left ventricular ejection fraction, by estimation, is 60 to 65%. The  left ventricle has normal function. The left ventricle has no regional  wall motion abnormalities. Left ventricular diastolic parameters were  normal.   2. Right ventricular systolic function is normal. The right ventricular  size is normal.   3. The mitral valve is normal in structure. No evidence of mitral valve  regurgitation. No evidence of mitral stenosis.   4. The aortic valve is normal in structure. Aortic  valve regurgitation is  not visualized. Aortic valve sclerosis/calcification is present, without  any evidence of aortic stenosis.   5. The inferior vena cava is normal in size with greater than 50%  respiratory variability, suggesting right atrial pressure of 3 mmHg.   Physical Exam VS:  BP (!) 140/98 (BP Location: Left Arm, Patient Position: Sitting, Cuff Size: Normal)   Pulse (!) 55   Ht 5\' 7"  (1.702 m)   Wt 198 lb 2 oz (89.9 kg)   SpO2 98%   BMI 31.03 kg/m    Wt Readings from Last 3 Encounters:  02/22/24 198 lb 2 oz (89.9 kg)  02/14/24 200 lb (90.7 kg)  10/03/22 187 lb (84.8 kg)    GEN: Well nourished, well developed in no acute distress NECK: No JVD; No carotid bruits CARDIAC: RRR, no murmurs, rubs, gallops RESPIRATORY:  Clear to auscultation without rales, wheezing or rhonchi  ABDOMEN: Soft, non-tender, non-distended EXTREMITIES:  No edema; No deformity   ASSESSMENT AND PLAN  Stroke Patient was recently admitted for a stroke of right PCA treated conservatively. 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 Echo showed normal LVEF. Plan was for TEE in the hospital, but we were unable to do this. He is here for TEE consent. Plan for ILR implantation and has apt to see EP later this month. The patient denies chest pain, SOB, LLE. He is back to normal function. Continue ASA+ Plavix  x 21 days, followed by ASA only. Continue Lipitor.      Informed Consent   Shared  Decision Making/Informed Consent   The risks [esophageal damage, perforation (1:10,000 risk), bleeding, pharyngeal hematoma as well as other potential complications associated with conscious sedation including aspiration, arrhythmia, respiratory failure and death], benefits (treatment guidance and diagnostic support) and alternatives of a transesophageal echocardiogram were discussed in detail with Mr. Siska and he is willing to proceed.      Dispo: PRN  Signed, Yoko Mcgahee Rebekah Canada, PA-C

## 2024-02-22 NOTE — Patient Instructions (Signed)
 Medication Instructions:   Your physician recommends that you continue on your current medications as directed. Please refer to the Current Medication list given to you today.   *If you need a refill on your cardiac medications before your next appointment, please call your pharmacy*  Lab Work:  No labs ordered today  If you have labs (blood work) drawn today and your tests are completely normal, you will receive your results only by: MyChart Message (if you have MyChart) OR A paper copy in the mail If you have any lab test that is abnormal or we need to change your treatment, we will call you to review the results.  Testing/Procedures:  You are scheduled for a TEE (Transesophageal Echocardiogram) on Friday, June 13 with Dr. Junnie Olives.  Please arrive at the [facility] Entrance of [facility], [address], [place] [address] at 6:30 AM (This is 1 hour(s) prior to your procedure time).  Proceed to the Check-In Desk directly inside the entrance.   Procedure Parking: Use the entrance off of the [address] side of the hospital. Turn right upon entering and follow the driveway to parking that is directly in front of the [facility]. There is no valet parking available at this entrance, however there is an awning directly in front of the [facility] for drop off/ pick up for patients.    DIET:  Nothing to eat or drink after midnight except a sip of water with medications (see medication instructions below)   LABS: Completed on 02/14/24   FYI:  For your safety, and to allow us  to monitor your vital signs accurately during the surgery/procedure we request: If you have artificial nails, gel coating, SNS etc, please have those removed prior to your surgery/procedure. Not having the nail coverings /polish removed may result in cancellation or delay of your surgery/procedure.   Your support person will be asked to wait in the waiting room during your  procedure.  It is OK to have someone drop you off and come back when you are ready to be discharged.  You cannot drive after the procedure and will need someone to drive you home.   Bring your insurance cards.   *Special Note: Every effort is made to have your procedure done on time. Occasionally there are emergencies that occur at the hospital that may cause delays. Please be patient if a delay does occur.      Follow-Up: At [facility], you and your health needs are our priority.  As part of our continuing mission to provide you with exceptional heart care, our providers are all part of one team.  This team includes your primary Cardiologist (physician) and Advanced Practice Providers or APPs (Physician Assistants and Nurse Practitioners) who all work together to provide you with the care you need, when you need it.  Your next appointment:     Provider:  Suzann Riddle, NP  03/15/2024 @ 9:30am

## 2024-02-27 NOTE — Telephone Encounter (Signed)
 Patient was seen on 02/22/24 and all information has been done for his scheduled procedure. Left detailed information that all documentation has been done so no need to call back unless he should have any questions regarding his instructions or procedure.   Scheduled for: TEE Date: 03/01/24 Time: 07:30 am Arrival Time: 06:30 am Location: [facility] Instructions: Instructed to call back if any questions regarding his instructions.   Left detailed message to call back if any further questions with arrival time, location, and number to give us  a call.

## 2024-02-27 NOTE — Telephone Encounter (Signed)
 Left voicemail message to call back in order to schedule appointment prior to his upcoming procedure.

## 2024-02-29 ENCOUNTER — Telehealth: Payer: Self-pay | Admitting: *Deleted

## 2024-02-29 NOTE — Telephone Encounter (Signed)
 Spoke with patient and confirmed date, time and location for his scheduled procedure tomorrow. Confirmed arrival time of 11:30 am and he verbalized understanding with no further questions at this time.

## 2024-02-29 NOTE — Telephone Encounter (Signed)
 Left a message for the patient to call back.  The patient's TEE had to be moved to 12:30 with arrival time of 11:30.

## 2024-02-29 NOTE — Telephone Encounter (Addendum)
 Scheduled for: TEE Date: 03/01/24 Time: 12:30 pm Arrival Time: 11:30 am Location: [facility] Instructions: Instructed to call back if any questions regarding his instructions.    Left detailed message to call back if any further questions with arrival time, location, and number to give us  a call.

## 2024-02-29 NOTE — Telephone Encounter (Signed)
 Patient has been made aware of the time change and verbalized his understanding.

## 2024-03-01 ENCOUNTER — Ambulatory Visit
Admission: RE | Admit: 2024-03-01 | Discharge: 2024-03-01 | Disposition: A | Discharge status: Home / Self Care | Source: Ambulatory Visit | Attending: Cardiology | Admitting: Cardiology

## 2024-03-01 ENCOUNTER — Ambulatory Visit: Admitting: Certified Registered Nurse Anesthetist

## 2024-03-01 ENCOUNTER — Encounter: Payer: Self-pay | Admitting: Cardiology

## 2024-03-01 ENCOUNTER — Encounter
Admission: RE | Disposition: A | Discharge status: Home / Self Care | Payer: Self-pay | Source: Ambulatory Visit | Attending: Cardiology

## 2024-03-01 ENCOUNTER — Ambulatory Visit (HOSPITAL_BASED_OUTPATIENT_CLINIC_OR_DEPARTMENT_OTHER)
Admission: RE | Admit: 2024-03-01 | Discharge: 2024-03-01 | Disposition: A | Discharge status: Home / Self Care | Source: Ambulatory Visit | Attending: Medical | Admitting: Medical

## 2024-03-01 ENCOUNTER — Other Ambulatory Visit: Payer: Self-pay

## 2024-03-01 DIAGNOSIS — I639 Cerebral infarction, unspecified: Secondary | ICD-10-CM | POA: Diagnosis present

## 2024-03-01 DIAGNOSIS — Z87891 Personal history of nicotine dependence: Secondary | ICD-10-CM | POA: Diagnosis not present

## 2024-03-01 DIAGNOSIS — Z7982 Long term (current) use of aspirin: Secondary | ICD-10-CM | POA: Insufficient documentation

## 2024-03-01 DIAGNOSIS — I6389 Other cerebral infarction: Secondary | ICD-10-CM

## 2024-03-01 DIAGNOSIS — Z7902 Long term (current) use of antithrombotics/antiplatelets: Secondary | ICD-10-CM | POA: Insufficient documentation

## 2024-03-01 HISTORY — PX: TEE WITHOUT CARDIOVERSION: SHX5443

## 2024-03-01 SURGERY — ECHOCARDIOGRAM, TRANSESOPHAGEAL
Anesthesia: General

## 2024-03-01 MED ORDER — PROPOFOL 1000 MG/100ML IV EMUL
INTRAVENOUS | Status: AC
  Filled 2024-03-01: qty 100

## 2024-03-01 MED ORDER — BUTAMBEN-TETRACAINE-BENZOCAINE 2-2-14 % EX AERO
INHALATION_SPRAY | CUTANEOUS | Status: AC
  Filled 2024-03-01: qty 5

## 2024-03-01 MED ORDER — PROPOFOL 10 MG/ML IV BOLUS
INTRAVENOUS | Status: DC | PRN
  Administered 2024-03-01: 30 mg via INTRAVENOUS
  Administered 2024-03-01 (×3): 20 mg via INTRAVENOUS
  Administered 2024-03-01 (×2): 30 mg via INTRAVENOUS

## 2024-03-01 MED ORDER — SODIUM CHLORIDE 0.9% FLUSH: 3.0000 mL | Freq: Two times a day (BID) | INTRAVENOUS | Status: DC

## 2024-03-01 MED ORDER — SODIUM CHLORIDE 0.9 % IV SOLN: INTRAVENOUS | Status: DC | PRN

## 2024-03-01 MED ORDER — SODIUM CHLORIDE 0.9% FLUSH: 3.0000 mL | INTRAVENOUS | Status: DC | PRN

## 2024-03-01 MED ORDER — LIDOCAINE VISCOUS HCL 2 % MT SOLN
OROMUCOSAL | Status: AC
  Filled 2024-03-01: qty 15

## 2024-03-01 NOTE — Procedures (Signed)
 Transesophageal Echocardiogram :  Indication: stroke Requesting/ordering  physician:   Procedure: 10 ml of viscous lidocaine  were given orally to provide local anesthesia to the oropharynx. The patient was positioned supine on the left side, bite block provided. The patient was sedated per anesthesia team.  Using digital technique an omniplane probe was advanced into the esophagus without incident.    See report in EPIC  for complete details: In brief, imaging revealed normal LV function with no RWMAs and no mural apical thrombus.  .  Estimated ejection fraction was 55-60%.  Right sided cardiac chambers were normal with no evidence of pulmonary hypertension.  Imaging of the septum showed no ASD or VSD Bubble study was negative for shunt 2D and color flow confirmed no PFO  The LA was well visualized in orthogonal views.  There was no spontaneous contrast and no thrombus in the LA and LA appendage   Aortic valve is bicuspid.  The descending thoracic aorta had no  mural aortic debris with no evidence of aneurysmal dilation or dissection  Conclusion -normal EF -no ASD or thrombus -Bicuspid aortic valve. -No cardiac findings to suggest etiology of stroke.   Brandon Galvan 03/01/2024 1:32 PM

## 2024-03-01 NOTE — Anesthesia Preprocedure Evaluation (Signed)
 Anesthesia Evaluation  Patient identified by MRN, date of birth, ID band Patient awake    Reviewed: Allergy & Precautions, NPO status , Patient's Chart, lab work & pertinent test results  Airway Mallampati: II  TM Distance: >3 FB Neck ROM: full    Dental  (+) Teeth Intact   Pulmonary neg pulmonary ROS, former smoker   Pulmonary exam normal breath sounds clear to auscultation       Cardiovascular Exercise Tolerance: Good negative cardio ROS Normal cardiovascular exam Rhythm:Regular Rate:Normal     Neuro/Psych negative neurological ROS  negative psych ROS   GI/Hepatic negative GI ROS, Neg liver ROS,,,  Endo/Other  negative endocrine ROS    Renal/GU negative Renal ROS  negative genitourinary   Musculoskeletal   Abdominal   Peds negative pediatric ROS (+)  Hematology negative hematology ROS (+)   Anesthesia Other Findings Past Medical History: No date: Arthritis     Comment:  right hip No date: Depression     Comment:  h/o No date: Family history of diabetes mellitus No date: History of kidney stones     Comment:  still has a kidney stone as of 04-16-19  No date: Hyperlipidemia No date: Kidney stone  Past Surgical History: 08/23/2021: CATARACT EXTRACTION W/PHACO; Right     Comment:  Procedure: CATARACT EXTRACTION PHACO AND INTRAOCULAR               LENS PLACEMENT (IOC) RIGHT;  Surgeon: Rosa College,              MD;  Location: [facility];  Service:               Ophthalmology;  Laterality: Right;  LEAVE ARRIVAL               8 3.83 00:32.1 09/06/2021: CATARACT EXTRACTION W/PHACO; Left     Comment:  Procedure: CATARACT EXTRACTION PHACO AND INTRAOCULAR               LENS PLACEMENT (IOC) LEFT;  Surgeon: Rosa College,               MD;  Location: [facility];  Service:               Ophthalmology;  Laterality: Left;  2.23 00:25.8 06/15/2017: COLONOSCOPY WITH PROPOFOL ; N/A      Comment:  Procedure: COLONOSCOPY WITH PROPOFOL ;  Surgeon: Luke Salaam, MD;  Location: [facility];  Service:               Gastroenterology;  Laterality: N/A; No date: LITHOTRIPSY 04/25/2019: TOTAL HIP ARTHROPLASTY; Right     Comment:  Procedure: TOTAL HIP ARTHROPLASTY ANTERIOR APPROACH;                Surgeon: Molli Angelucci, MD;  Location: [facility];                Service: Orthopedics;  Laterality: Right; 10/15/2021: TOTAL HIP ARTHROPLASTY; Left     Comment:  Procedure: TOTAL HIP ARTHROPLASTY ANTERIOR APPROACH;                Surgeon: Molli Angelucci, MD;  Location: [facility];                Service: Orthopedics;  Laterality: Left;  BMI    Body Mass Index: 30.38 kg/m      Reproductive/Obstetrics negative OB ROS  Anesthesia Physical Anesthesia Plan  ASA: 2  Anesthesia Plan: General   Post-op Pain Management:    Induction: Intravenous  PONV Risk Score and Plan: Propofol  infusion and TIVA  Airway Management Planned: Natural Airway and Nasal Cannula  Additional Equipment:   Intra-op Plan:   Post-operative Plan:   Informed Consent: I have reviewed the patients History and Physical, chart, labs and discussed the procedure including the risks, benefits and alternatives for the proposed anesthesia with the patient or authorized representative who has indicated his/her understanding and acceptance.     Dental Advisory Given  Plan Discussed with: CRNA  Anesthesia Plan Comments:          Anesthesia Quick Evaluation

## 2024-03-01 NOTE — Transfer of Care (Signed)
 Immediate Anesthesia Transfer of Care Note  Patient: Brandon Galvan  Procedure(s) Performed: ECHOCARDIOGRAM, TRANSESOPHAGEAL  Patient Location: [facility]  Anesthesia Type:General  Level of Consciousness: sedated and drowsy  Airway & Oxygen Therapy: Patient Spontanous Breathing and Patient connected to nasal cannula oxygen  Post-op Assessment: Report given to RN and Post -op Vital signs reviewed and stable  Post vital signs: Reviewed and stable  Last Vitals:  Vitals Value Taken Time  BP 97/67 03/01/24 13:34  Temp    Pulse 54 03/01/24 13:34  Resp 19 03/01/24 13:34  SpO2 97 % 03/01/24 13:34    Last Pain:  Vitals:   03/01/24 1138  PainSc: 0-No pain         Complications: No notable events documented.

## 2024-03-01 NOTE — Anesthesia Procedure Notes (Signed)
 Date/Time: 03/01/2024 1:10 PM  Performed by: Angelia Kelp, CRNAPre-anesthesia Checklist: Patient identified, Emergency Drugs available, Suction available, Patient being monitored and Timeout performed Patient Re-evaluated:Patient Re-evaluated prior to induction Oxygen Delivery Method: Nasal cannula Preoxygenation: Pre-oxygenation with 100% oxygen Induction Type: IV induction

## 2024-03-01 NOTE — Anesthesia Postprocedure Evaluation (Signed)
 Anesthesia Post Note  Patient: Brandon Galvan  Procedure(s) Performed: ECHOCARDIOGRAM, TRANSESOPHAGEAL  Patient location during evaluation: PACU Anesthesia Type: General Level of consciousness: awake Pain management: satisfactory to patient Vital Signs Assessment: post-procedure vital signs reviewed and stable Respiratory status: spontaneous breathing Cardiovascular status: stable Anesthetic complications: no   No notable events documented.   Last Vitals:  Vitals:   03/01/24 1334 03/01/24 1343  BP: 97/67 112/89  Pulse: (!) 54 (!) 56  Resp: 19 14  Temp:    SpO2: 97% 97%    Last Pain:  Vitals:   03/01/24 1343  PainSc: 0-No pain                 VAN STAVEREN,Zelda Reames

## 2024-03-01 NOTE — Interval H&P Note (Signed)
 History and Physical Interval Note:  03/01/2024 1:09 PM  Brandon Galvan  has presented today for surgery, with the diagnosis of  Stroke.  The various methods of treatment have been discussed with the patient and family. After consideration of risks, benefits and other options for treatment, the patient has consented to  Procedure(s): ECHOCARDIOGRAM, TRANSESOPHAGEAL (N/A) as a surgical intervention.  The patient's history has been reviewed, patient examined, no change in status, stable for surgery.  I have reviewed the patient's chart and labs.  Questions were answered to the patient's satisfaction.     Polly Brink Agbor-Etang

## 2024-03-01 NOTE — Progress Notes (Signed)
*  PRELIMINARY RESULTS* Echocardiogram Echocardiogram Transesophageal has been performed.  Brandon Galvan 03/01/2024, 1:35 PM

## 2024-03-04 ENCOUNTER — Encounter: Payer: Self-pay | Admitting: Cardiology

## 2024-03-13 NOTE — Progress Notes (Signed)
 Electrophysiology Clinic Note    Date:  03/15/2024  Patient ID:  Brandon Galvan, Brandon Galvan December 23, 1955, MRN [id number] PCP:  Cleotilde Oneil FALCON, MD  Cardiologist:  None Electrophysiologist: None    Discussed the use of AI scribe software for clinical note transcription with the patient, who gave verbal consent to proceed.   Patient Profile    Chief Complaint: stroke follow-up  History of Present Illness: Brandon Galvan is a 68 y.o. male with PMH notable for cryptogenic stroke, HLD; seen today for post hospital follow up after recent CVA.    Admitted 5/28-29 with stroke-like symptoms of L-sided numbness, hand weakness. Neurology consulted. He presents today to discuss ILR implant.   He is healing well from his recent stroke. No palpitations, though has several family members (on wife's side) that have been diagnosed with AFib. .  he denies chest pain, palpitations, dyspnea, PND, orthopnea, nausea, vomiting, dizziness, syncope, edema, weight gain, or early satiety.    He travels frequently for work but consistently has Friday's off    Arrhythmia/Device History No specialty comments available.    ROS:  Please see the history of present illness. All other systems are reviewed and otherwise negative.    Physical Exam    VS:  BP (!) 142/82 (BP Location: Left Arm, Patient Position: Sitting, Cuff Size: Normal)   Pulse (!) 52   Ht 5' 6 (1.676 m)   Wt 198 lb (89.8 kg)   SpO2 98%   BMI 31.96 kg/m  BMI: Body mass index is 31.96 kg/m.      Wt Readings from Last 3 Encounters:  03/15/24 198 lb (89.8 kg)  03/01/24 194 lb (88 kg)  02/22/24 198 lb 2 oz (89.9 kg)     GEN- The patient is well appearing, alert and oriented x 3 today.   Lungs- Clear to ausculation bilaterally, normal work of breathing.  Heart- Regular rate and rhythm, no murmurs, rubs or gallops Extremities- No peripheral edema, warm, dry    Studies Reviewed   Previous EP, cardiology notes.    EKG is  ordered. Personal review of EKG from today shows:     EKG Interpretation Date/Time:  Friday March 15 2024 10:05:59 EDT Ventricular Rate:  52 PR Interval:  154 QRS Duration:  108 QT Interval:  422 QTC Calculation: 392 R Axis:   15  Text Interpretation: Sinus bradycardia Incomplete right bundle branch block Confirmed by Clemma Johnsen [phone]) on 03/15/2024 10:11:31 AM    All EKGs in chart reviewed    TEE, 03/01/2024 -normal EF -no ASD or thrombus -Bicuspid aortic valve. -No cardiac findings to suggest etiology of stroke.  TTE, 02/14/2024  1. Left ventricular ejection fraction, by estimation, is 60 to 65%. The left ventricle has normal function. The left ventricle has no regional wall motion abnormalities. Left ventricular diastolic parameters were normal.   2. Right ventricular systolic function is normal. The right ventricular  size is normal.   3. The mitral valve is normal in structure. No evidence of mitral valve regurgitation. No evidence of mitral stenosis.   4. The aortic valve is normal in structure. Aortic valve regurgitation is not visualized. Aortic valve sclerosis/calcification is present, without any evidence of aortic stenosis.   5. The inferior vena cava is normal in size with greater than 50%  respiratory variability, suggesting right atrial pressure of 3 mmHg.    Assessment and Plan     #) cryptogenic stroke Recent CVA with negative TEE. I spoke  at length with the patient about monitoring for afib with an implantable loop recorder.  Risks, benefits, and alteratives to implantable loop recorder were discussed with the patient today.   At this time, the patient is very clear in their decision to proceed with implantable loop recorder. He requests for it to be scheduled on a Friday d/t his work schedule. He is agreeable to drive to [facility] for this.      Current medicines are reviewed at length with the patient today.   The patient does not have concerns  regarding his medicines.  The following changes were made today:  none  Labs/ tests ordered today include:  Orders Placed This Encounter  Procedures   EKG 12-Lead     Disposition: Follow up with EP MD for ILR implant on Friday       Signed, Chantal Needle, NP  03/15/24  12:09 PM  Electrophysiology [facility]

## 2024-03-15 ENCOUNTER — Ambulatory Visit: Attending: Cardiology | Admitting: Cardiology

## 2024-03-15 VITALS — BP 142/82 | HR 52 | Ht 66.0 in | Wt 198.0 lb

## 2024-03-15 DIAGNOSIS — I639 Cerebral infarction, unspecified: Secondary | ICD-10-CM | POA: Diagnosis not present

## 2024-03-15 NOTE — Patient Instructions (Addendum)
 Medication Instructions:  The current medical regimen is effective;  continue present plan and medications as directed. Please refer to the Current Medication list given to you today.   *If you need a refill on your cardiac medications before your next appointment, please call your pharmacy*  Testing:  Implantable Loop Recorder Placement, Care After This sheet gives you information about how to care for yourself after your procedure. Your health care provider may also give you more specific instructions. If you have problems or questions, contact your health care provider. What can I expect after the procedure? After the procedure, it is common to have: Soreness or discomfort near the incision. Some swelling or bruising near the incision.  Follow these instructions at home: Incision care  Monitor your cardiac device site for redness, swelling, and drainage. Call the device clinic at [phone] if you experience these symptoms or fever/chills.  Keep the large square bandage on your site for 24 hours and then you may remove it yourself. Keep the steri-strips underneath in place.   You may shower after 72 hours / 3 days from your procedure with the steri-strips in place. They will usually fall off on their own, or may be removed after 10 days. Pat dry.   Avoid lotions, ointments, or perfumes over your incision until it is well-healed.  Please do not submerge in water until your site is completely healed.   Your device is MRI compatible.   Remote monitoring is used to monitor your cardiac device from home. This monitoring is scheduled every month by our office. It allows us  to keep an eye on the function of your device to ensure it is working properly.  If your wound site starts to bleed apply pressure.    For help with the monitor please call Med[employer]onic Monitor Support Specialist directly at [phone].    If you have any questions/concerns please call the device clinic at  [phone].  Activity  Return to your normal activities.  General instructions Follow instructions from your health care provider about how to manage your implantable loop recorder and transmit the information. Learn how to activate a recording if this is necessary for your type of device. You may go through a metal detection gate, and you may let someone hold a metal detector over your chest. Show your ID card if needed. Do not have an MRI unless you check with your health care provider first. Take over-the-counter and prescription medicines only as told by your health care provider. Keep all follow-up visits as told by your health care provider. This is important. Contact a health care provider if: You have redness, swelling, or pain around your incision. You have a fever. You have pain that is not relieved by your pain medicine. You have triggered your device because of fainting (syncope) or because of a heartbeat that feels like it is racing, slow, fluttering, or skipping (palpitations). Get help right away if you have: Chest pain. Difficulty breathing. Summary After the procedure, it is common to have soreness or discomfort near the incision. Change your dressing as told by your health care provider. Follow instructions from your health care provider about how to manage your implantable loop recorder and transmit the information. Keep all follow-up visits as told by your health care provider. This is important. This information is not intended to replace advice given to you by your health care provider. Make sure you discuss any questions you have with your health care provider. Document Released: 08/17/2015 Document  Revised: 10/21/2017 Document Reviewed: 10/21/2017 Elsevier Patient Education  2020 [employer].   Follow-Up: At [facility], you and your health needs are our priority.  As part of our continuing mission to provide you with exceptional heart care, our  providers are all part of one team.  This team includes your primary Cardiologist (physician) and Advanced Practice Providers or APPs (Physician Assistants and Nurse Practitioners) who all work together to provide you with the care you need, when you need it.  Your next appointment:   August 22nd at 8:15 AM   Provider:   Ole Holts, MD    We recommend signing up for the patient portal called MyChart.  Sign up information is provided on this After Visit Summary.  MyChart is used to connect with patients for Virtual Visits (Telemedicine).  Patients are able to view lab/test results, encounter notes, upcoming appointments, etc.  Non-urgent messages can be sent to your provider as well.   To learn more about what you can do with MyChart, go to [email or web address].

## 2024-03-26 LAB — ECHO TEE

## 2024-03-27 ENCOUNTER — Ambulatory Visit: Payer: Self-pay | Admitting: Medical

## 2024-05-08 ENCOUNTER — Encounter: Payer: Self-pay | Admitting: Urology

## 2024-05-10 ENCOUNTER — Ambulatory Visit: Admitting: Cardiology

## 2024-06-13 NOTE — Progress Notes (Unsigned)
 Electrophysiology Office Note:    Date:  06/14/2024   ID:  Brandon Galvan, DOB Aug 18, 1956, MRN [id number]  [facility] Cardiologist:  None  [facility] Electrophysiologist:  OLE ONEIDA HOLTS, MD   Referring MD: Brandon Galvan FALCON, MD   Chief Complaint: Cryptogenic stroke  History of Present Illness:    [name] is a 68 year old man who I am seeing today for an evaluation of cryptogenic stroke and loop recorder implant at the request of Dr. Cleotilde.  The patient also has a history of hyperlipidemia.  He was hospitalized in May of this year with stroke symptoms.  No history of atrial fibrillation.      Their past medical, social and family history was reviewed.   ROS:   Please see the history of present illness.    All other systems reviewed and are negative.  EKGs/Labs/Other Studies Reviewed:    The following studies were reviewed today:  Feb 15, 2024 echo EF 60% No significant valve abnormalities  EKGs reviewed.  No evidence of atrial fibrillation.      Physical Exam:    VS:  BP 136/86 (BP Location: Left Arm, Patient Position: Sitting, Cuff Size: Large)   Pulse (!) 58   Ht 5' 6 (1.676 m)   Wt 198 lb (89.8 kg)   SpO2 96%   BMI 31.96 kg/m     Wt Readings from Last 3 Encounters:  06/14/24 198 lb (89.8 kg)  03/15/24 198 lb (89.8 kg)  03/01/24 194 lb (88 kg)     GEN: no distress CARD: RRR, No MRG RESP: No IWOB. CTAB.        ASSESSMENT AND PLAN:    1. Cryptogenic stroke [facility])     #Cryptogenic Stroke Pathophysiology of cryptogenic stroke was discussed in detail during today's clinic appointment. I discussed the role of loop recorder monitoring in patients who have suffered a CVA/TIA. There has been no evidence of AF thus far in the patient's evaluation. Loop recorder monitors were discussed in detail including the implant procedure and its risks. I discussed the monthly monitoring costs associated with loop recorder monitoring. The patient would  like to proceed with ILR implant.     Signed, OLE ONEIDA. HOLTS, MD, [facility], [facility] 06/14/2024 8:40 AM    Electrophysiology [facility]  ----------------------  SURGEON:  OLE ONEIDA HOLTS, MD     PREPROCEDURE DIAGNOSIS:  Cryptogenic stroke    POSTPROCEDURE DIAGNOSIS: Cryptogenic stroke     PROCEDURES:   1. Implantable loop recorder implantation    INTRODUCTION:  Brandon Galvan presents with a history of cryptogenic stroke The costs of loop recorder monitoring have been discussed with the patient.    DESCRIPTION OF PROCEDURE:  Informed written consent was obtained.  A preprocedural timeout was performed with the RN Huntley). The patient required no sedation for the procedure today.  Mapping over the patient's chest was performed to identify the area where electrograms were most prominent for ILR recording.  This area was found to be the left parasternal region over the 4th intercostal space. The patients left chest was therefore prepped and draped in the usual sterile fashion. The skin overlying the left parasternal region was infiltrated with lidocaine  for local analgesia.  A 0.5-cm incision was made over the left parasternal region over the 3rd intercostal space.  A subcutaneous ILR pocket was fashioned using a combination of sharp and blunt dissection.  A Medtronic Reveal LINQ 2 implantable loop recorder was then placed into the  pocket  R waves were very prominent and measured >0.62mV.  Steri- Strips and a sterile dressing were then applied.  There were no early apparent complications.     CONCLUSIONS:   1. Successful implantation of a implantable loop recorder for Cryptogenic stroke  2. No early apparent complications.   Ole T. Cindie, MD, [facility], [facility] Cardiac Electrophysiology

## 2024-06-14 ENCOUNTER — Encounter: Payer: Self-pay | Admitting: Cardiology

## 2024-06-14 ENCOUNTER — Ambulatory Visit: Attending: Cardiology | Admitting: Cardiology

## 2024-06-14 VITALS — BP 136/86 | HR 58 | Ht 66.0 in | Wt 198.0 lb

## 2024-06-14 DIAGNOSIS — I639 Cerebral infarction, unspecified: Secondary | ICD-10-CM | POA: Diagnosis not present

## 2024-06-14 NOTE — Patient Instructions (Addendum)
Medication Instructions:  Your physician recommends that you continue on your current medications as directed. Please refer to the Current Medication list given to you today.  Labwork: None ordered.  Testing/Procedures: None ordered.  Follow-Up:  As needed with Dr. Lalla Brothers  Implantable Loop Recorder Placement, Care After This sheet gives you information about how to care for yourself after your procedure. Your health care provider may also give you more specific instructions. If you have problems or questions, contact your health care provider. What can I expect after the procedure? After the procedure, it is common to have: Soreness or discomfort near the incision. Some swelling or bruising near the incision.  Follow these instructions at home: Incision care  Monitor your cardiac device site for redness, swelling, and drainage. Call the device clinic at [phone] if you experience these symptoms or fever/chills.  Keep the large square bandage on your site for 24 hours and then you may remove it yourself. Keep the steri-strips underneath in place.   You may shower after 72 hours / 3 days from your procedure with the steri-strips in place. They will usually fall off on their own, or may be removed after 10 days. Pat dry.   Avoid lotions, ointments, or perfumes over your incision until it is well-healed.  Please do not submerge in water until your site is completely healed.   Your device is MRI compatible.   Remote monitoring is used to monitor your cardiac device from home. This monitoring is scheduled every month by our office. It allows [place] to keep an eye on the function of your device to ensure it is working properly.  If your wound site starts to bleed apply pressure.    For help with the monitor please call [employer] Monitor Support Specialist directly at [phone].    If you have any questions/concerns please call the device clinic at  [phone].  Activity  Return to your normal activities.  General instructions Follow instructions from your health care provider about how to manage your implantable loop recorder and transmit the information. Learn how to activate a recording if this is necessary for your type of device. You may go through a metal detection gate, and you may let someone hold a metal detector over your chest. Show your ID card if needed. Do not have an MRI unless you check with your health care provider first. Take over-the-counter and prescription medicines only as told by your health care provider. Keep all follow-up visits as told by your health care provider. This is important. Contact a health care provider if: You have redness, swelling, or pain around your incision. You have a fever. You have pain that is not relieved by your pain medicine. You have triggered your device because of fainting (syncope) or because of a heartbeat that feels like it is racing, slow, fluttering, or skipping (palpitations). Get help right away if you have: Chest pain. Difficulty breathing. Summary After the procedure, it is common to have soreness or discomfort near the incision. Change your dressing as told by your health care provider. Follow instructions from your health care provider about how to manage your implantable loop recorder and transmit the information. Keep all follow-up visits as told by your health care provider. This is important. This information is not intended to replace advice given to you by your health care provider. Make sure you discuss any questions you have with your health care provider. Document Released: 08/17/2015 Document Revised: 10/21/2017 Document Reviewed: 10/21/2017 Elsevier Patient  Education  2020 Elsevier Inc.  

## 2024-07-16 LAB — CUP PACEART REMOTE DEVICE CHECK
Date Time Interrogation Session: 20251027104440
Implantable Pulse Generator Implant Date: 20250926

## 2024-07-17 ENCOUNTER — Ambulatory Visit: Payer: Self-pay | Admitting: Cardiology

## 2024-07-18 ENCOUNTER — Encounter

## 2024-08-19 ENCOUNTER — Ambulatory Visit

## 2024-08-19 DIAGNOSIS — I639 Cerebral infarction, unspecified: Secondary | ICD-10-CM

## 2024-08-20 LAB — CUP PACEART REMOTE DEVICE CHECK
Date Time Interrogation Session: 20251130234116
Implantable Pulse Generator Implant Date: 20250926

## 2024-08-23 NOTE — Progress Notes (Signed)
 Remote Loop Recorder Transmission

## 2024-08-25 ENCOUNTER — Ambulatory Visit: Payer: Self-pay | Admitting: Cardiology

## 2024-09-19 ENCOUNTER — Ambulatory Visit

## 2024-09-19 DIAGNOSIS — I639 Cerebral infarction, unspecified: Secondary | ICD-10-CM

## 2024-09-20 LAB — CUP PACEART REMOTE DEVICE CHECK
Date Time Interrogation Session: 20251231233517
Implantable Pulse Generator Implant Date: 20250926

## 2024-09-21 ENCOUNTER — Ambulatory Visit: Payer: Self-pay | Admitting: Cardiology

## 2024-09-24 NOTE — Progress Notes (Signed)
 Remote Loop Recorder Transmission

## 2024-10-04 ENCOUNTER — Other Ambulatory Visit: Payer: Self-pay

## 2024-10-07 ENCOUNTER — Other Ambulatory Visit: Payer: Self-pay

## 2024-10-07 ENCOUNTER — Other Ambulatory Visit

## 2024-10-07 DIAGNOSIS — Z125 Encounter for screening for malignant neoplasm of prostate: Secondary | ICD-10-CM

## 2024-10-08 LAB — PSA: Prostate Specific Ag, Serum: 3 ng/mL (ref 0.0–4.0)

## 2024-10-09 ENCOUNTER — Ambulatory Visit: Admitting: Urology

## 2024-10-10 ENCOUNTER — Ambulatory Visit: Payer: Self-pay | Admitting: Urology

## 2024-10-18 ENCOUNTER — Ambulatory Visit: Admitting: Urology

## 2024-10-18 VITALS — BP 155/84 | HR 52 | Ht 66.0 in | Wt 200.0 lb

## 2024-10-18 DIAGNOSIS — Z87440 Personal history of urinary (tract) infections: Secondary | ICD-10-CM

## 2024-10-18 DIAGNOSIS — Z125 Encounter for screening for malignant neoplasm of prostate: Secondary | ICD-10-CM | POA: Diagnosis not present

## 2024-10-18 DIAGNOSIS — N529 Male erectile dysfunction, unspecified: Secondary | ICD-10-CM

## 2024-10-18 DIAGNOSIS — N401 Enlarged prostate with lower urinary tract symptoms: Secondary | ICD-10-CM

## 2024-10-18 LAB — BLADDER SCAN AMB NON-IMAGING

## 2024-10-18 MED ORDER — TADALAFIL 20 MG PO TABS: 20.0000 mg | ORAL_TABLET | Freq: Every day | ORAL | 3 refills | Status: AC | PRN

## 2024-10-18 NOTE — Patient Instructions (Signed)

## 2024-10-18 NOTE — Progress Notes (Signed)
" ° °  10/18/2024 4:24 PM   Brandon Galvan 10-22-1955 [id number]  Reason for visit: Follow up PSA screening, ED, history of UTI  HPI: Healthy 69 year old male with no family history of prostate cancer who we have followed for variable PSA values as well as ED. He also had a TIA/stroke this year and is now on Plavix .  No significant residual deficits.  He has a history of 2 mildly elevated PSA values of 4.27 and 4.25 in June and August 2020, and actually had 2 UTIs around that time as well.  Further evaluation of his infections with a renal ultrasound was benign aside from a nonobstructing 5 mm left lower pole stone.  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  He opted for repeat PSA in December 2020 which dropped back down to normal at 2.9 and he opted to continue yearly screening.  DRE has been benign.  PSA has remained low at approximately 3 over the last few years.  Most recent PSA stable at 3.0.  No further UTI symptoms.  PVR today normal at 65ml.  He has ED responsive to Cialis  20 mg on demand, denies any changes over the last year.  ED: Cialis  refilled PSA screening: PSA remains normal and stable at 3.0, recheck next year History of UTI: Last infection was 2020, no problems since then  Brandon JAYSON Burnet, MD  [facility] [address] [place], [place] [address] [phone]   "

## 2024-10-20 ENCOUNTER — Ambulatory Visit: Attending: Cardiology

## 2024-10-20 DIAGNOSIS — I639 Cerebral infarction, unspecified: Secondary | ICD-10-CM

## 2024-10-21 ENCOUNTER — Encounter

## 2024-10-22 LAB — CUP PACEART REMOTE DEVICE CHECK
Date Time Interrogation Session: 20260131232931
Implantable Pulse Generator Implant Date: 20250926

## 2024-10-25 NOTE — Progress Notes (Signed)
 Remote Loop Recorder Transmission

## 2024-11-20 ENCOUNTER — Ambulatory Visit

## 2024-11-21 ENCOUNTER — Encounter

## 2025-10-17 ENCOUNTER — Other Ambulatory Visit

## 2025-10-21 ENCOUNTER — Ambulatory Visit: Admitting: Urology
